# Patient Record
Sex: Male | Born: 1995 | Race: White | Hispanic: No | Marital: Single | Smoking: Former smoker
Health system: Southern US, Community
[De-identification: ages and names within clinical notes are randomized; demographics above are authoritative.]

## PROBLEM LIST (undated history)

## (undated) DIAGNOSIS — F319 Bipolar disorder, unspecified: Secondary | ICD-10-CM

## (undated) DIAGNOSIS — F419 Anxiety disorder, unspecified: Secondary | ICD-10-CM

## (undated) DIAGNOSIS — F259 Schizoaffective disorder, unspecified: Secondary | ICD-10-CM

## (undated) HISTORY — PX: TYMPANOSTOMY TUBE PLACEMENT: SHX32

---
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
## 2016-01-07 ENCOUNTER — Other Ambulatory Visit: Payer: Self-pay | Admitting: Nurse Practitioner

## 2016-01-07 DIAGNOSIS — E559 Vitamin D deficiency, unspecified: Secondary | ICD-10-CM

## 2016-01-07 LAB — BASIC METABOLIC PANEL
BUN/Creatinine Ratio: 16 (ref 9–20)
BUN: 14 mg/dL (ref 6–20)
CALCIUM: 9.2 mg/dL (ref 8.7–10.2)
CO2: 22 mmol/L (ref 18–29)
CREATININE: 0.89 mg/dL (ref 0.76–1.27)
Chloride: 100 mmol/L (ref 96–106)
GFR calc Af Amer: 143 mL/min/{1.73_m2} (ref >59)
GFR, EST NON AFRICAN AMERICAN: 124 mL/min/{1.73_m2} (ref >59)
Glucose: 92 mg/dL (ref 65–99)
POTASSIUM: 4.4 mmol/L (ref 3.5–5.2)
Sodium: 140 mmol/L (ref 134–144)

## 2016-01-07 LAB — CBC WITH DIFFERENTIAL/PLATELET
Basophils Absolute: 0 10*3/uL (ref 0.0–0.2)
Basos: 1 %
EOS (ABSOLUTE): 0.3 10*3/uL (ref 0.0–0.4)
EOS: 6 %
HEMATOCRIT: 45.2 % (ref 37.5–51.0)
HEMOGLOBIN: 15.6 g/dL (ref 12.6–17.7)
IMMATURE GRANULOCYTES: 1 %
Immature Grans (Abs): 0 10*3/uL (ref 0.0–0.1)
Lymphocytes Absolute: 1.9 10*3/uL (ref 0.7–3.1)
Lymphs: 35 %
MCH: 29.4 pg (ref 26.6–33.0)
MCHC: 34.5 g/dL (ref 31.5–35.7)
MCV: 85 fL (ref 79–97)
MONOCYTES: 11 %
Monocytes Absolute: 0.6 10*3/uL (ref 0.1–0.9)
NEUTROS PCT: 46 %
Neutrophils Absolute: 2.6 10*3/uL (ref 1.4–7.0)
Platelets: 220 10*3/uL (ref 150–379)
RBC: 5.3 x10E6/uL (ref 4.14–5.80)
RDW: 13.3 % (ref 12.3–15.4)
WBC: 5.4 10*3/uL (ref 3.4–10.8)

## 2016-01-07 LAB — HEPATIC FUNCTION PANEL
ALBUMIN: 4.4 g/dL (ref 3.5–5.5)
ALK PHOS: 61 IU/L (ref 39–117)
ALT: 35 IU/L (ref 0–44)
AST: 27 IU/L (ref 0–40)
BILIRUBIN, DIRECT: 0.11 mg/dL (ref 0.00–0.40)
Bilirubin Total: 0.4 mg/dL (ref 0.0–1.2)
TOTAL PROTEIN: 6.5 g/dL (ref 6.0–8.5)

## 2016-01-07 LAB — VITAMIN D 25 HYDROXY (VIT D DEFICIENCY, FRACTURES): Vit D, 25-Hydroxy: 18.4 ng/mL — ABNORMAL LOW (ref 30.0–100.0)

## 2016-01-07 LAB — TSH: TSH: 2.06 u[IU]/mL (ref 0.450–4.500)

## 2016-01-07 MED ORDER — VITAMIN D (ERGOCALCIFEROL) 1.25 MG (50000 UNIT) PO CAPS: 50000.0000 [IU] | ORAL_CAPSULE | ORAL | 2 refills | Status: DC

## 2016-02-15 ENCOUNTER — Ambulatory Visit (INDEPENDENT_AMBULATORY_CARE_PROVIDER_SITE_OTHER): Payer: Medicaid Other | Admitting: Nurse Practitioner

## 2016-02-15 ENCOUNTER — Encounter: Payer: Self-pay | Admitting: Nurse Practitioner

## 2016-02-15 VITALS — BP 122/72 | Temp 98.6°F | Ht 69.0 in | Wt 201.2 lb

## 2016-02-15 DIAGNOSIS — S46912A Strain of unspecified muscle, fascia and tendon at shoulder and upper arm level, left arm, initial encounter: Secondary | ICD-10-CM | POA: Diagnosis not present

## 2016-02-15 MED ORDER — METHOCARBAMOL 500 MG PO TABS: 500.0000 mg | ORAL_TABLET | Freq: Three times a day (TID) | ORAL | 1 refills | Status: DC | PRN

## 2016-02-15 NOTE — Progress Notes (Signed)
Subjective:  Presents for complaints of left shoulder popping that is been going on for several weeks. No history of injury. No pain in the shoulder joint area. Occasional spasm or discomfort in the upper back area. No limitations on activities. Saw Dr. Romeo AppleHarrison for right shoulder pain back in the summer. Has run out of his muscle relaxant which greatly helps his symptoms.  Objective:   BP 122/72   Temp 98.6 F (37 C) (Oral)   Ht 5\' 9"  (1.753 m)   Wt 201 lb 3.2 oz (91.3 kg)   BMI 29.71 kg/m  NAD. Alert, oriented. Lungs clear. Heart regular rate rhythm. Tight mildly tender muscles noted in the upper back area left side. Normal range of motion left shoulder, no tenderness. Mild popping noted near the before meals joint. Hand and arm strength 5+ bilateral.  Assessment: Strain of left shoulder, initial encounter  Plan:  Meds ordered this encounter  Medications  . methocarbamol (ROBAXIN) 500 MG tablet    Sig: Take 1 tablet (500 mg total) by mouth every 8 (eight) hours as needed for muscle spasms.    Dispense:  30 tablet    Refill:  1    Order Specific Question:   Supervising Provider    Answer:   Merlyn AlbertLUKING, Raiquan S [[id number]]  . Cariprazine HCl (VRAYLAR PO)    Sig: Take by mouth.   Stretching/shoulder exercises. Biofreeze as directed. Ice/heat applications. Muscle relaxant, use sparingly. Call back in 2-3 weeks if no improvement, sooner if worse.

## 2016-03-29 ENCOUNTER — Ambulatory Visit (INDEPENDENT_AMBULATORY_CARE_PROVIDER_SITE_OTHER): Payer: Medicaid Other | Admitting: Family Medicine

## 2016-03-29 VITALS — BP 122/74 | Temp 97.7°F | Wt 197.8 lb

## 2016-03-29 DIAGNOSIS — J019 Acute sinusitis, unspecified: Secondary | ICD-10-CM | POA: Diagnosis not present

## 2016-03-29 DIAGNOSIS — B9689 Other specified bacterial agents as the cause of diseases classified elsewhere: Secondary | ICD-10-CM | POA: Diagnosis not present

## 2016-03-29 MED ORDER — AMOXICILLIN 500 MG PO TABS: 500.0000 mg | ORAL_TABLET | Freq: Three times a day (TID) | ORAL | 0 refills | Status: DC

## 2016-03-29 NOTE — Progress Notes (Signed)
   Subjective:    Patient ID: David StampsWilliam M Rehm, male    DOB: [birth year], 20 y.o.   MRN: [id number]  Cough  This is a new problem. The current episode started yesterday. Associated symptoms include rhinorrhea and a sore throat. Pertinent negatives include no chest pain, ear pain, fever or wheezing. Treatments tried: Cough drops.  The treatment provided no relief.   The patient presents with some head congestion has been going on since Friday with drainage some coughing no wheezing or vomiting. Patient states no other concerns this visit.   Review of Systems  Constitutional: Negative for activity change and fever.  HENT: Positive for congestion, rhinorrhea and sore throat. Negative for ear pain.   Eyes: Negative for discharge.  Respiratory: Positive for cough. Negative for wheezing.   Cardiovascular: Negative for chest pain.       Objective:   Physical Exam  Constitutional: He appears well-developed.  HENT:  Head: Normocephalic.  Mouth/Throat: Oropharynx is clear and moist. No oropharyngeal exudate.  Neck: Normal range of motion.  Cardiovascular: Normal rate, regular rhythm and normal heart sounds.   No murmur heard. Pulmonary/Chest: Effort normal and breath sounds normal. He has no wheezes.  Lymphadenopathy:    He has no cervical adenopathy.  Neurological: He exhibits normal muscle tone.  Skin: Skin is warm and dry.  Nursing note and vitals reviewed.         Assessment & Plan:  Viral syndrome Secondary rhinosinusitis Antibiotics prescribed warning signs discussed follow-up if problems

## 2016-04-20 ENCOUNTER — Ambulatory Visit (INDEPENDENT_AMBULATORY_CARE_PROVIDER_SITE_OTHER): Payer: Medicaid Other | Admitting: Otolaryngology

## 2016-04-20 DIAGNOSIS — H6123 Impacted cerumen, bilateral: Secondary | ICD-10-CM

## 2016-04-24 ENCOUNTER — Telehealth: Payer: Self-pay | Admitting: Family Medicine

## 2016-04-24 MED ORDER — CEFPROZIL 500 MG PO TABS: 500.0000 mg | ORAL_TABLET | Freq: Two times a day (BID) | ORAL | 0 refills | Status: DC

## 2016-04-24 NOTE — Telephone Encounter (Signed)
Patient was seen for viral syndrome and secondary sinus infection -given amoxil. Patient still having problems with sore throat after finishing med

## 2016-04-24 NOTE — Telephone Encounter (Signed)
Cefzil 500 mg 1 twice a day for 7 days if ongoing troubles needs office visit

## 2016-04-24 NOTE — Telephone Encounter (Signed)
Pt has taken all of the antibiotic and his throat is still hurting. Pt is needing to know what else to do. Please advise.

## 2016-04-24 NOTE — Telephone Encounter (Signed)
Prescription sent electronically to pharmacy. Patient notified. 

## 2016-05-30 ENCOUNTER — Ambulatory Visit (INDEPENDENT_AMBULATORY_CARE_PROVIDER_SITE_OTHER): Payer: Medicaid Other | Admitting: Family Medicine

## 2016-05-30 ENCOUNTER — Encounter: Payer: Self-pay | Admitting: Family Medicine

## 2016-05-30 ENCOUNTER — Ambulatory Visit: Payer: Medicaid Other | Admitting: Family Medicine

## 2016-05-30 VITALS — Temp 98.0°F | Ht 69.0 in | Wt 196.0 lb

## 2016-05-30 DIAGNOSIS — G8929 Other chronic pain: Secondary | ICD-10-CM

## 2016-05-30 DIAGNOSIS — R059 Cough, unspecified: Secondary | ICD-10-CM

## 2016-05-30 DIAGNOSIS — R140 Abdominal distension (gaseous): Secondary | ICD-10-CM

## 2016-05-30 DIAGNOSIS — M545 Low back pain, unspecified: Secondary | ICD-10-CM

## 2016-05-30 DIAGNOSIS — R05 Cough: Secondary | ICD-10-CM | POA: Diagnosis not present

## 2016-05-30 DIAGNOSIS — R1110 Vomiting, unspecified: Secondary | ICD-10-CM | POA: Diagnosis not present

## 2016-05-30 NOTE — Progress Notes (Signed)
   Subjective:    Patient ID: David StampsWilliam M Gironda, male    DOB: 01/06/1996, 21 y.o.   MRN: [id number]  Cough  This is a new problem. The current episode started 1 to 4 weeks ago. Associated symptoms include nasal congestion and rhinorrhea. Pertinent negatives include no chest pain, ear pain, fever or wheezing. Associated symptoms comments: Vomiting from cough.   This patient relates that he's been having a cough for no good reason denies fever sweats chills denies weight loss denies change and appetite cough comes and goes sometimes very deep It does cause posttussive vomiting that occurs intermittently over the past couple weeks. Patient denies heartburn denies abdominal pain sweats or chills. States he just once make sure nothing bad is going on  Patient also relates low back pain and stiffness and discomfort occasional radiation into the left leg he's been trying some over the counter measures along with stretches it seems to help some  Review of Systems  Constitutional: Negative for activity change and fever.  HENT: Positive for rhinorrhea. Negative for congestion and ear pain.   Eyes: Negative for discharge.  Respiratory: Positive for cough. Negative for wheezing.   Cardiovascular: Negative for chest pain.       Objective:   Physical Exam  Constitutional: He appears well-developed.  HENT:  Head: Normocephalic.  Mouth/Throat: Oropharynx is clear and moist. No oropharyngeal exudate.  Neck: Normal range of motion.  Cardiovascular: Normal rate, regular rhythm and normal heart sounds.   No murmur heard. Pulmonary/Chest: Effort normal and breath sounds normal. He has no wheezes.  Lymphadenopathy:    He has no cervical adenopathy.  Neurological: He exhibits normal muscle tone.  Skin: Skin is warm and dry.  Nursing note and vitals reviewed. Abdomen is soft no guarding rebound or tenderness Subjected discomfort in the lower back very tight hamstrings negative straight leg  raise  Patient also relates intermittent bloating sensation in the abdomen when he eats it is not linked any specific food no diarrhea associated with it. We will do lab work to rule out intra-abdominal pathology. We will also help rule out liver dysfunction pancreatic dysfunction celiac disease and also do chest x-ray because intermittent coughing     Assessment & Plan:  Cough-possibly environmental. No sign of any serious pathology. Chest x-ray recommended. Posttussive vomiting related to coughing Robitussin-DM when necessary lab work ordered  Intermittent vomiting related to abdominal bloating lab work ordered no sign of serious pathology Lumbar pain and discomfort intermittent sciatica exercises for hamstring and low back were shown to the patient-information was printed and given to him follow-up if ongoing troubles

## 2016-05-31 ENCOUNTER — Ambulatory Visit (HOSPITAL_COMMUNITY)
Admission: RE | Admit: 2016-05-31 | Discharge: 2016-05-31 | Disposition: A | Discharge status: Home / Self Care | Payer: Medicaid Other | Source: Ambulatory Visit | Attending: Family Medicine | Admitting: Family Medicine

## 2016-05-31 DIAGNOSIS — R05 Cough: Secondary | ICD-10-CM | POA: Diagnosis not present

## 2016-06-02 LAB — CBC WITH DIFFERENTIAL/PLATELET
Basophils Absolute: 0 10*3/uL (ref 0.0–0.2)
Basos: 0 %
EOS (ABSOLUTE): 0.3 10*3/uL (ref 0.0–0.4)
EOS: 6 %
HEMATOCRIT: 46.9 % (ref 37.5–51.0)
Hemoglobin: 15.3 g/dL (ref 13.0–17.7)
Immature Grans (Abs): 0 10*3/uL (ref 0.0–0.1)
Immature Granulocytes: 0 %
LYMPHS ABS: 1.8 10*3/uL (ref 0.7–3.1)
Lymphs: 30 %
MCH: 28.3 pg (ref 26.6–33.0)
MCHC: 32.6 g/dL (ref 31.5–35.7)
MCV: 87 fL (ref 79–97)
MONOCYTES: 11 %
Monocytes Absolute: 0.7 10*3/uL (ref 0.1–0.9)
NEUTROS ABS: 3.2 10*3/uL (ref 1.4–7.0)
Neutrophils: 53 %
Platelets: 261 10*3/uL (ref 150–379)
RBC: 5.41 x10E6/uL (ref 4.14–5.80)
RDW: 14.2 % (ref 12.3–15.4)
WBC: 5.9 10*3/uL (ref 3.4–10.8)

## 2016-06-02 LAB — HEPATIC FUNCTION PANEL
ALK PHOS: 73 IU/L (ref 39–117)
ALT: 18 IU/L (ref 0–44)
AST: 21 IU/L (ref 0–40)
Albumin: 4.8 g/dL (ref 3.5–5.5)
Bilirubin Total: 0.5 mg/dL (ref 0.0–1.2)
Bilirubin, Direct: 0.16 mg/dL (ref 0.00–0.40)
TOTAL PROTEIN: 7.1 g/dL (ref 6.0–8.5)

## 2016-06-02 LAB — TISSUE TRANSGLUTAMINASE, IGA

## 2016-06-02 LAB — LIPASE: Lipase: 24 U/L (ref 13–78)

## 2016-06-04 ENCOUNTER — Encounter: Payer: Self-pay | Admitting: Family Medicine

## 2017-03-08 IMAGING — DX DG CHEST 2V
2 series · 2 of 2 positions shown · non-contrast
Comparison: None.

CLINICAL DATA: Cough for a month

EXAM:
CHEST  2 VIEW

[chest pa]
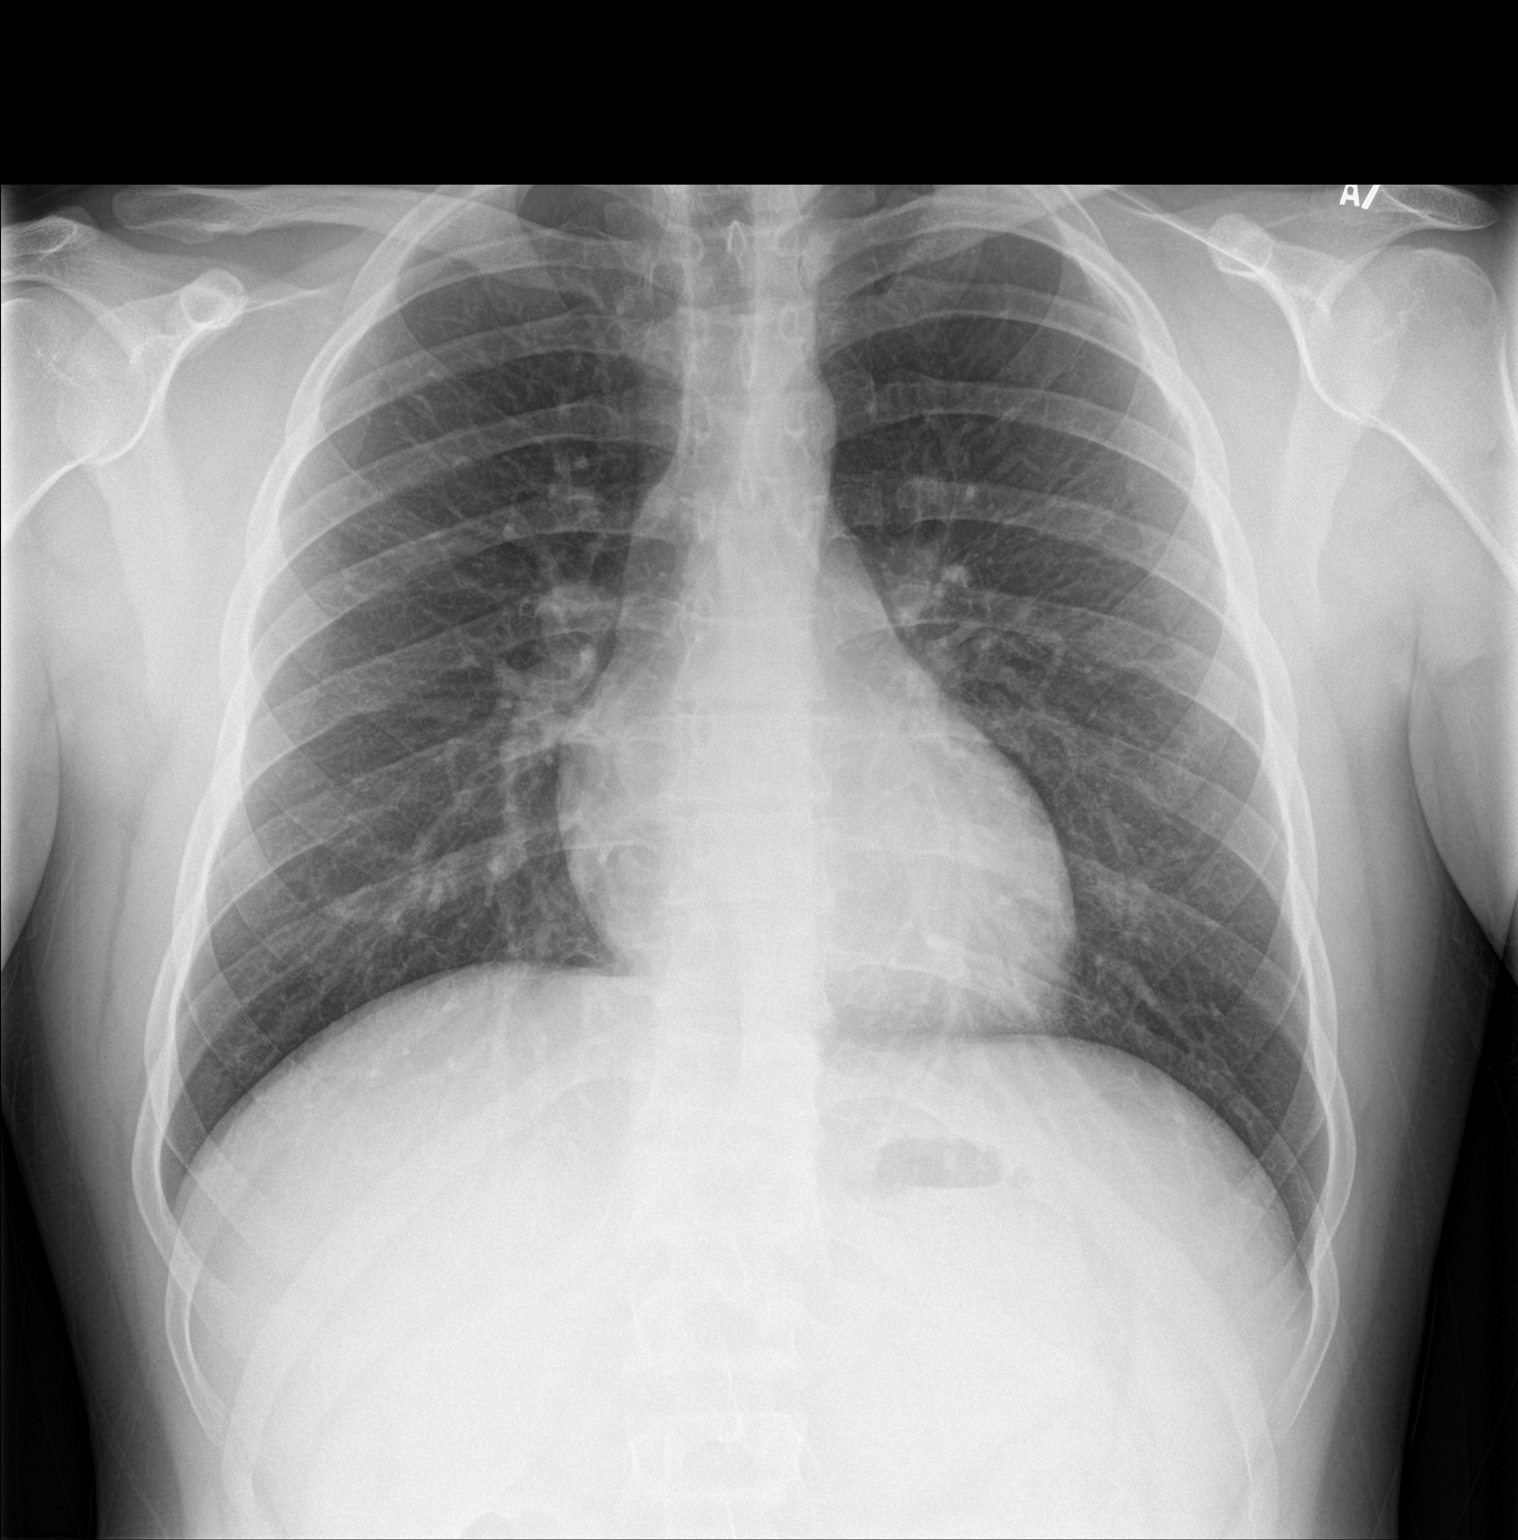

[chest lat]
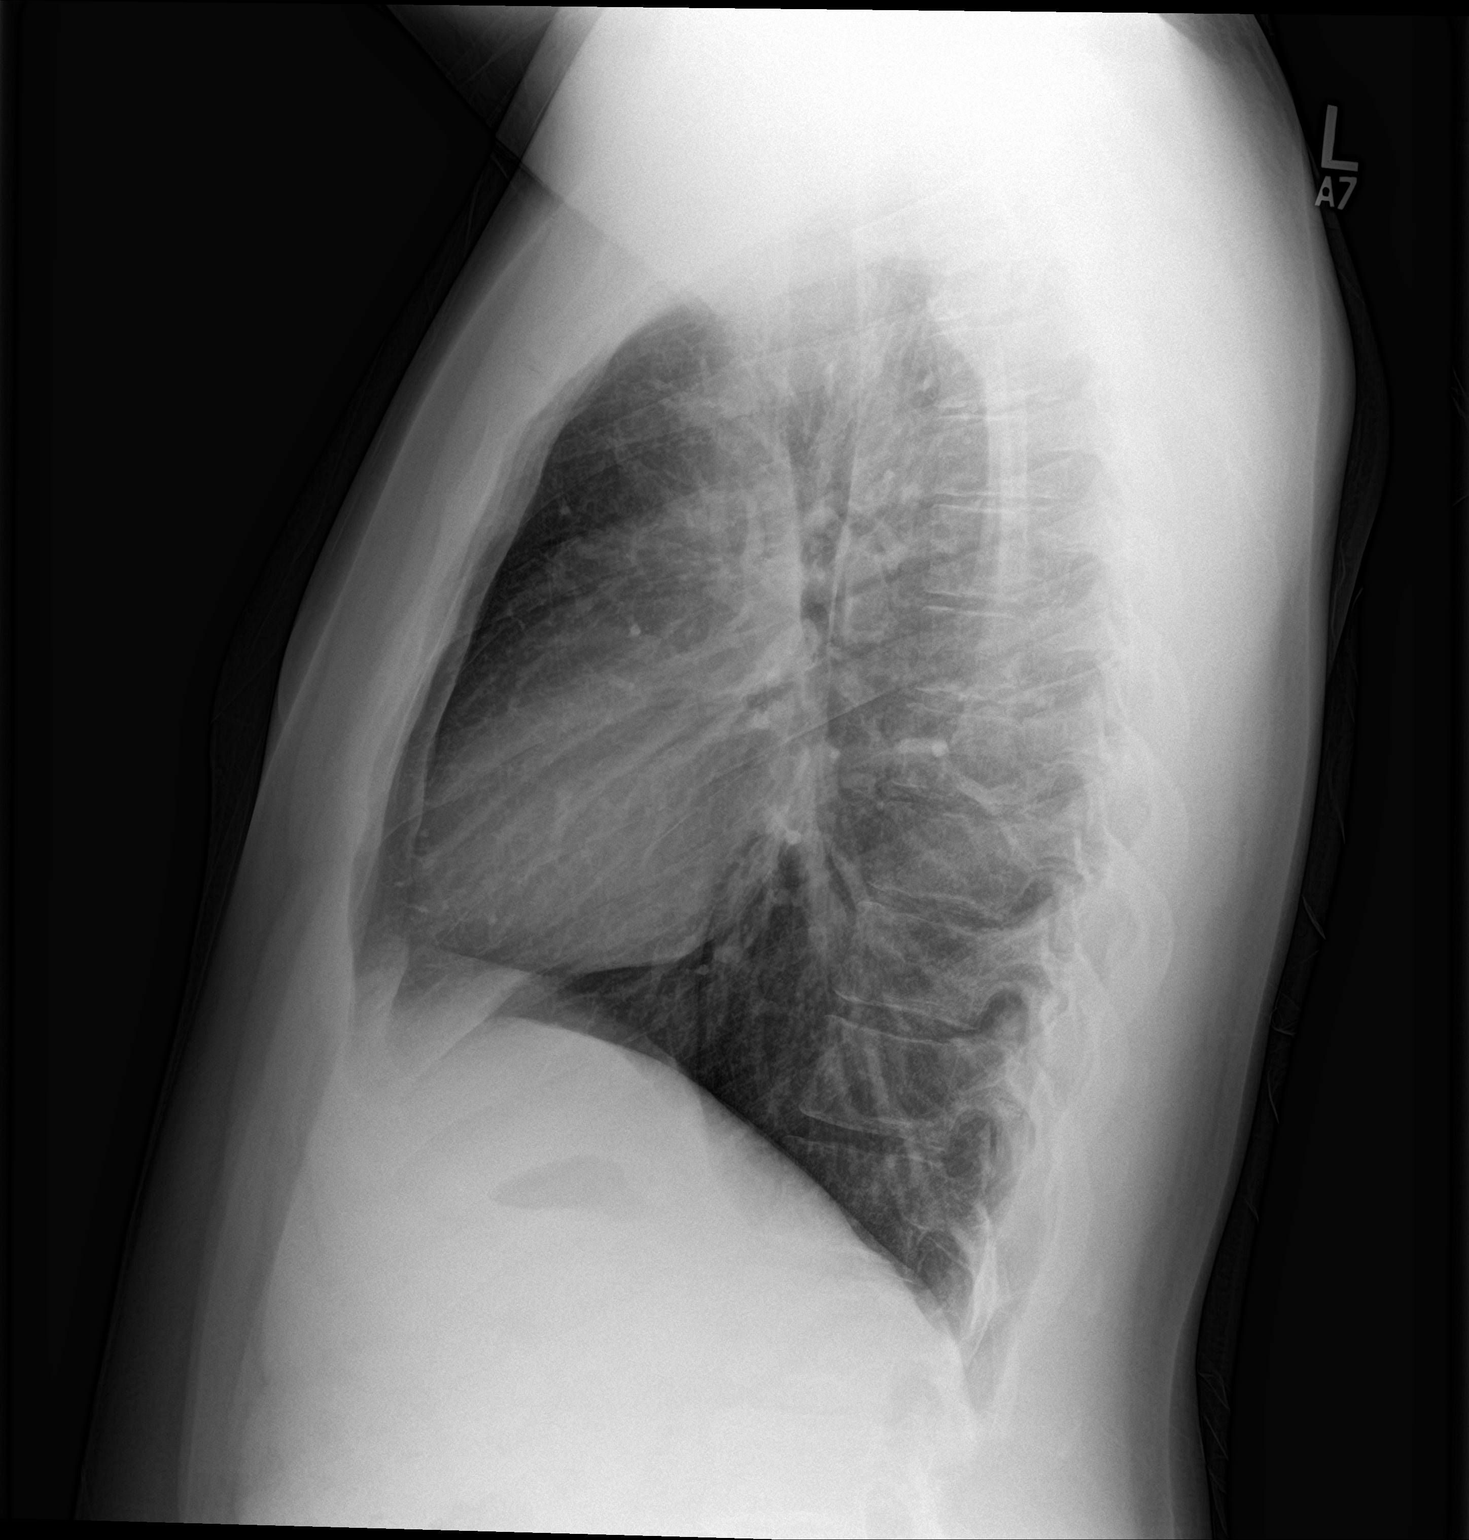

[2 of 2 positions shown; findings below may reference images not displayed]

FINDINGS: Heart and mediastinal contours are within normal limits. No focal
opacities or effusions. No acute bony abnormality.
IMPRESSION: No active cardiopulmonary disease.

## 2017-03-22 ENCOUNTER — Ambulatory Visit (INDEPENDENT_AMBULATORY_CARE_PROVIDER_SITE_OTHER): Payer: Medicaid Other

## 2017-03-22 DIAGNOSIS — Z23 Encounter for immunization: Secondary | ICD-10-CM

## 2017-04-11 ENCOUNTER — Telehealth: Payer: Self-pay

## 2017-04-11 NOTE — Telephone Encounter (Signed)
Patient called states he has been having som left side chest pain today. No other concerns/compliants,No Shortness of breath.Was told to go to the Ed to be evaluated,as they are capable of doing scans and blood work. He states understanding.

## 2017-04-19 ENCOUNTER — Ambulatory Visit (INDEPENDENT_AMBULATORY_CARE_PROVIDER_SITE_OTHER): Payer: Medicaid Other | Admitting: Otolaryngology

## 2017-04-19 DIAGNOSIS — H6123 Impacted cerumen, bilateral: Secondary | ICD-10-CM

## 2017-05-10 ENCOUNTER — Emergency Department (HOSPITAL_COMMUNITY)
Admission: EM | Admit: 2017-05-10 | Discharge: 2017-05-11 | Disposition: A | Discharge status: Other Acute Inpatient Hospital | Payer: Medicaid Other | Attending: Emergency Medicine | Admitting: Emergency Medicine

## 2017-05-10 DIAGNOSIS — R45851 Suicidal ideations: Secondary | ICD-10-CM | POA: Insufficient documentation

## 2017-05-10 DIAGNOSIS — Z022 Encounter for examination for admission to residential institution: Secondary | ICD-10-CM | POA: Diagnosis not present

## 2017-05-10 DIAGNOSIS — R440 Auditory hallucinations: Secondary | ICD-10-CM | POA: Diagnosis not present

## 2017-05-10 DIAGNOSIS — F251 Schizoaffective disorder, depressive type: Secondary | ICD-10-CM | POA: Diagnosis not present

## 2017-05-10 DIAGNOSIS — Z79899 Other long term (current) drug therapy: Secondary | ICD-10-CM | POA: Diagnosis not present

## 2017-05-10 DIAGNOSIS — F22 Delusional disorders: Secondary | ICD-10-CM

## 2017-05-10 DIAGNOSIS — Z008 Encounter for other general examination: Secondary | ICD-10-CM

## 2017-05-10 DIAGNOSIS — F29 Unspecified psychosis not due to a substance or known physiological condition: Secondary | ICD-10-CM | POA: Diagnosis not present

## 2017-05-10 DIAGNOSIS — F200 Paranoid schizophrenia: Secondary | ICD-10-CM | POA: Diagnosis present

## 2017-05-10 LAB — CBC WITH DIFFERENTIAL/PLATELET
Basophils Absolute: 0 10*3/uL (ref 0.0–0.1)
Basophils Relative: 0 %
Eosinophils Absolute: 0.2 10*3/uL (ref 0.0–0.7)
Eosinophils Relative: 2 %
HEMATOCRIT: 51 % (ref 39.0–52.0)
HEMOGLOBIN: 17.4 g/dL — AB (ref 13.0–17.0)
LYMPHS PCT: 20 %
Lymphs Abs: 1.4 10*3/uL (ref 0.7–4.0)
MCH: 30.2 pg (ref 26.0–34.0)
MCHC: 34.1 g/dL (ref 30.0–36.0)
MCV: 88.4 fL (ref 78.0–100.0)
MONOS PCT: 9 %
Monocytes Absolute: 0.6 10*3/uL (ref 0.1–1.0)
NEUTROS ABS: 4.7 10*3/uL (ref 1.7–7.7)
NEUTROS PCT: 69 %
Platelets: 265 10*3/uL (ref 150–400)
RBC: 5.77 MIL/uL (ref 4.22–5.81)
RDW: 12.4 % (ref 11.5–15.5)
WBC: 6.8 10*3/uL (ref 4.0–10.5)

## 2017-05-10 LAB — COMPREHENSIVE METABOLIC PANEL
ALK PHOS: 58 U/L (ref 38–126)
ALT: 15 U/L — AB (ref 17–63)
ANION GAP: 9 (ref 5–15)
AST: 16 U/L (ref 15–41)
Albumin: 5 g/dL (ref 3.5–5.0)
BILIRUBIN TOTAL: 1.3 mg/dL — AB (ref 0.3–1.2)
BUN: 16 mg/dL (ref 6–20)
CALCIUM: 9.6 mg/dL (ref 8.9–10.3)
CO2: 26 mmol/L (ref 22–32)
CREATININE: 1.09 mg/dL (ref 0.61–1.24)
Chloride: 101 mmol/L (ref 101–111)
GFR calc non Af Amer: >60 mL/min (ref >60)
Glucose, Bld: 98 mg/dL (ref 65–99)
Potassium: 4.4 mmol/L (ref 3.5–5.1)
Sodium: 136 mmol/L (ref 135–145)
TOTAL PROTEIN: 7.8 g/dL (ref 6.5–8.1)

## 2017-05-10 LAB — RAPID URINE DRUG SCREEN, HOSP PERFORMED
AMPHETAMINES: NOT DETECTED
BARBITURATES: NOT DETECTED
Benzodiazepines: NOT DETECTED
Cocaine: NOT DETECTED
Opiates: NOT DETECTED
Tetrahydrocannabinol: NOT DETECTED

## 2017-05-10 LAB — ACETAMINOPHEN LEVEL

## 2017-05-10 LAB — ETHANOL: Alcohol, Ethyl (B): <10 mg/dL (ref <10)

## 2017-05-10 LAB — SALICYLATE LEVEL

## 2017-05-10 MED ORDER — ONDANSETRON HCL 4 MG PO TABS: 4.0000 mg | ORAL_TABLET | Freq: Three times a day (TID) | ORAL | Status: DC | PRN

## 2017-05-10 MED ORDER — ZOLPIDEM TARTRATE 5 MG PO TABS
5.0000 mg | ORAL_TABLET | Freq: Every evening | ORAL | Status: DC | PRN
  Administered 2017-05-11: 5 mg via ORAL
  Filled 2017-05-10: qty 1

## 2017-05-10 MED ORDER — ALUM & MAG HYDROXIDE-SIMETH 200-200-20 MG/5ML PO SUSP: 30.0000 mL | Freq: Four times a day (QID) | ORAL | Status: DC | PRN

## 2017-05-10 MED ORDER — IBUPROFEN 200 MG PO TABS: 600.0000 mg | ORAL_TABLET | Freq: Three times a day (TID) | ORAL | Status: DC | PRN

## 2017-05-10 NOTE — ED Triage Notes (Signed)
Patient's aunt who he lives with states that patient hasnt been taking his medications for 7-8 months. Paranoia has gotten worse since around Christmas.  Aunt also Reports that patient slept in bed with uncle last night due to afraid people are after him.   Patient has pocket knife on him that was taken and given to aunt who took to her car.

## 2017-05-10 NOTE — ED Notes (Signed)
Pt oriented to room and unit.  Pts grandmother is with him.  Pt appears very fearful.  Pt was reassured of his safety.  15 minute checks and video monitoring in place.

## 2017-05-10 NOTE — ED Notes (Signed)
Pt A&O x 3, no distress noted, cooperative and anxious.  Monitoring for safety, Q 15 min checks in effect.

## 2017-05-10 NOTE — BH Assessment (Signed)
Assessment Note  David StampsWilliam M Fear [place] 22 [place] with associated AH and SI. Pt reports having symptoms of paranoia for @ a week. Pt also reports hearing voices telling him that people are going to do things to harm him. Additionally, pt reports SI for the past 2 days. He denies a specific plan, but he has considered shooting himself. He denies access or intention to do this, however. Pt reports having similar symptoms before and taking medication prescribed by [facility]. Pt indicates that he started feeling better and stopped the medication @ 5 months ago. Pt is amenable to IP hospitalization. Pt did not appear to be responding to internal stimuli during assessment.   Diagnosis: Schizoaffective d/o, depressive type  Past Medical History: No past medical history on file.  Past Surgical History:  Procedure Laterality Date  . TYMPANOSTOMY TUBE PLACEMENT      Family History:  Family History  Problem Relation Age of Onset  . Heart attack Other   . Mental illness Mother     Social History:  reports that  has never smoked. he has never used smokeless tobacco. He reports that he does not drink alcohol or use drugs.  Additional Social History:  Alcohol / Drug Use Pain Medications: pt denies Prescriptions: pt denies Over the Counter: pt denies History of alcohol / drug use?: No history of alcohol / drug abuse  CIWA: CIWA-Ar BP: 135/82 Pulse Rate: 98 COWS:    Allergies: No Known Allergies  Home Medications:  (Not in a hospital admission)  OB/GYN Status:  No LMP for male patient.  General Assessment Data [facility] of Assessment: [facility] TTS Assessment: In system Is this a Tele or Face-to-Face Assessment?: Face-to-Face Is this an Initial Assessment or a Re-assessment for this encounter?: Initial Assessment Marital status: Single Living Arrangements: Other relatives(grandparents) Can pt return to current living arrangement?: Yes Admission Status:  Voluntary Is patient capable of signing voluntary admission?: Yes Referral Source: Self/Family/Friend Insurance type: Medicaid     Crisis Care Plan Living Arrangements: Other relatives(grandparents) Name of Psychiatrist: none Name of Therapist: none  Education Status Is patient currently in school?: No Name of school: supposed to start at [employer] on 1/7th  Risk to self with the past 6 months Suicidal Ideation: Yes-Currently Present Has patient been a risk to self within the past 6 months prior to admission? : No Suicidal Intent: No Has patient had any suicidal intent within the past 6 months prior to admission? : No Is patient at risk for suicide?: Yes Suicidal Plan?: Yes-Currently Present Has patient had any suicidal plan within the past 6 months prior to admission? : No Specify Current Suicidal Plan: fleeting plan of shooting self Access to Means: No Previous Attempts/Gestures: No Intentional Self Injurious Behavior: None Family Suicide History: No Recent stressful life event(s): Other (Comment) Persecutory voices/beliefs?: Yes Depression: No Substance abuse history and/or treatment for substance abuse?: No Suicide prevention information given to non-admitted patients: Not applicable  Risk to Others within the past 6 months Homicidal Ideation: No Does patient have any lifetime risk of violence toward others beyond the six months prior to admission? : No Thoughts of Harm to Others: No Current Homicidal Intent: No Current Homicidal Plan: No Access to Homicidal Means: No History of harm to others?: No Assessment of Violence: None Noted Does patient have access to weapons?: No Criminal Charges Pending?: No Does patient have a court date: No Is patient on probation?: No  Psychosis Hallucinations:  Auditory Delusions: Persecutory  Mental Status Report Appearance/Hygiene: Unremarkable Eye Contact: Good Motor Activity: Unremarkable Speech: Logical/coherent Level of  Consciousness: Quiet/awake Mood: Pleasant Affect: Blunted Anxiety Level: Minimal Thought Processes: Coherent, Relevant Judgement: Impaired Orientation: Person, Place, Time, Situation Obsessive Compulsive Thoughts/Behaviors: Unable to Assess  Cognitive Functioning Concentration: Normal Memory: Recent Intact, Remote Intact IQ: Average Insight: see judgement above Impulse Control: Unable to Assess Appetite: Poor Sleep: Decreased Total Hours of Sleep: 5 Vegetative Symptoms: None  ADLScreening [facility] Assessment Services) Patient's cognitive ability adequate to safely complete daily activities?: Yes Patient able to express need for assistance with ADLs?: Yes Independently performs ADLs?: Yes (appropriate for developmental age)  Prior Inpatient Therapy Prior Inpatient Therapy: No  Prior Outpatient Therapy Prior Outpatient Therapy: Yes Prior Therapy Dates: [facility] Prior Therapy Facilty/Provider(s): early 2018 Reason for Treatment: anxiety, AH Does patient have an ACCT team?: No Does patient have Intensive In-House Services?  : No Does patient have [facility] services? : No Does patient have [facility] services?: No  ADL Screening (condition at time of admission) Patient's cognitive ability adequate to safely complete daily activities?: Yes Is the patient deaf or have difficulty hearing?: No Does the patient have difficulty seeing, even when wearing glasses/contacts?: No Does the patient have difficulty concentrating, remembering, or making decisions?: No Patient able to express need for assistance with ADLs?: Yes Does the patient have difficulty dressing or bathing?: No Independently performs ADLs?: Yes (appropriate for developmental age) Does the patient have difficulty walking or climbing stairs?: No Weakness of Legs: None Weakness of Arms/Hands: None  Home Assistive Devices/Equipment Home Assistive Devices/Equipment: None    Abuse/Neglect Assessment (Assessment to be complete  while patient is alone) Abuse/Neglect Assessment Can Be Completed: Yes Physical Abuse: Denies Verbal Abuse: Denies Sexual Abuse: Denies Exploitation of patient/patient's resources: Denies Self-Neglect: Denies Values / Beliefs Cultural Requests During Hospitalization: None Spiritual Requests During Hospitalization: None   Advance Directives (For Healthcare) Does Patient Have a Medical Advance Directive?: No Would patient like information on creating a medical advance directive?: No - Patient declined Nutrition Screen- [facility] Patient's home diet: Regular Has the patient been eating poorly because of a decreased appetite?: Yes  Additional Information 1:1 In Past 12 Months?: No CIRT Risk: No Elopement Risk: No Does patient have medical clearance?: Yes     Disposition:  Disposition Initial Assessment Completed for this Encounter: Yes(consulted with Nanine Means, DNP) Disposition of Patient: Inpatient treatment program Type of inpatient treatment program: Adult  On Site Evaluation by:   Reviewed with Physician:    Laddie Aquas 05/10/2017 5:57 PM

## 2017-05-10 NOTE — BH Assessment (Signed)
[facility] Assessment Progress Note     Per Fransico MichaelKim Brooks, RN, Admissions Coordinator, [facility] 05[facility]/2017, pending discharges from the unit and bed availability.

## 2017-05-10 NOTE — ED Provider Notes (Signed)
[facility]-EMERGENCY DEPT Provider Note   CSN: [id number] Arrival date & time: 05/10/17  1439     History   Chief Complaint Chief Complaint  Patient presents with  . Paranoid; Suicidal    HPI David [name] is a 22 y.o. male with a PMHx of psychiatric condition, IBS, GERD, and other conditions listed below, who presents to the ED with complaints of paranoid thoughts, auditory hallucinations, and suicidal ideations x 1 week.  Patient states that he has had issues like this before, he was put on Vraylar for this in the past when he was being seen at [facility], but he "took himself off of his medications" about 5 months ago.  Over the last 1 week he has been paranoid thinking that people are going to come and harm him, and has been hearing voices telling him that people are going to hurt him.  He has been feeling suicidal with no specific plan.  He denies visual hallucinations or homicidal ideations.  He does not use drugs, drink alcohol, or use tobacco.  He is here voluntarily, and denies any other medical complaints at this time.   The history is provided by the patient and medical records. No language interpreter was used.  Mental Health Problem  Presenting symptoms: depression, hallucinations and suicidal thoughts   Presenting symptoms: no homicidal ideas   Onset quality:  Gradual Duration:  1 week Timing:  Constant Progression:  Unchanged Chronicity:  Recurrent Context: noncompliance   Treatment compliance:  None of the time Time since last psychoactive medication taken:  5 months Relieved by:  None tried Worsened by:  Nothing Ineffective treatments:  None tried Associated symptoms: no abdominal pain and no chest pain   Risk factors: hx of mental illness   Risk factors: no recent psychiatric admission     No past medical history on file.  Patient Active Problem List   Diagnosis Date Noted  . Vitamin D deficiency 01/07/2016  . Gastroesophageal  reflux disease with esophagitis 08/31/2015  . Anxiety 08/31/2015  . Irritable bowel syndrome 08/30/2012  . Abdominal pain, chronic, epigastric 08/02/2012  . Left ankle sprain 08/02/2012    Past Surgical History:  Procedure Laterality Date  . TYMPANOSTOMY TUBE PLACEMENT         Home Medications    Prior to Admission medications   Medication Sig Start Date End Date Taking? Authorizing Provider  Cariprazine HCl (VRAYLAR PO) Take by mouth.    [provider]  citalopram (CELEXA) 10 MG tablet Take 10 mg by mouth daily.    [provider]  hydrOXYzine (VISTARIL) 25 MG capsule Take 25 mg by mouth 3 (three) times daily as needed.    [provider]  indomethacin (INDOCIN) 25 MG capsule Take 1 capsule (25 mg total) by mouth 3 (three) times daily with meals. 10/28/15   Vickki Hearing, MD  Vitamin D, Ergocalciferol, (DRISDOL) 50000 units CAPS capsule Take 1 capsule (50,000 Units total) by mouth every 7 (seven) days. 01/07/16   Campbell Riches, NP    Family History Family History  Problem Relation Age of Onset  . Heart attack Other   . Mental illness Mother     Social History Social History   Tobacco Use  . Smoking status: Never Smoker  . Smokeless tobacco: Never Used  Substance Use Topics  . Alcohol use: No  . Drug use: No     Allergies   Patient has no known allergies.   Review of  Systems Review of Systems  Constitutional: Negative for chills and fever.  Respiratory: Negative for shortness of breath.   Cardiovascular: Negative for chest pain.  Gastrointestinal: Negative for abdominal pain, constipation, diarrhea, nausea and vomiting.  Genitourinary: Negative for dysuria and hematuria.  Musculoskeletal: Negative for arthralgias and myalgias.  Skin: Negative for color change.  Allergic/Immunologic: Negative for immunocompromised state.  Neurological: Negative for weakness and numbness.  Psychiatric/Behavioral: Positive for hallucinations  and suicidal ideas. Negative for confusion and homicidal ideas.   All other systems reviewed and are negative for acute change except as noted in the HPI.    Physical Exam Updated Vital Signs BP 135/82 (BP Location: Right Arm)   Pulse 98   Temp 98 F (36.7 C) (Oral)   Resp 15   SpO2 100%   Physical Exam  Constitutional: He is oriented to person, place, and time. Vital signs are normal. He appears well-developed and well-nourished.  Non-toxic appearance. No distress.  Afebrile, nontoxic, NAD  HENT:  Head: Normocephalic and atraumatic.  Mouth/Throat: Oropharynx is clear and moist and mucous membranes are normal.  Eyes: Conjunctivae and EOM are normal. Right eye exhibits no discharge. Left eye exhibits no discharge.  Neck: Normal range of motion. Neck supple.  Cardiovascular: Normal rate, regular rhythm, normal heart sounds and intact distal pulses. Exam reveals no gallop and no friction rub.  No murmur heard. Pulmonary/Chest: Effort normal and breath sounds normal. No respiratory distress. He has no decreased breath sounds. He has no wheezes. He has no rhonchi. He has no rales.  Abdominal: Soft. Normal appearance and bowel sounds are normal. He exhibits no distension. There is no tenderness. There is no rigidity, no rebound, no guarding, no CVA tenderness, no tenderness at McBurney's point and negative Murphy's sign.  Musculoskeletal: Normal range of motion.  Neurological: He is alert and oriented to person, place, and time. He has normal strength. No sensory deficit.  Skin: Skin is warm, dry and intact. No rash noted.  Psychiatric: His mood appears anxious. He is actively hallucinating. Thought content is paranoid. He exhibits a depressed mood. He expresses suicidal ideation. He expresses no homicidal ideation. He expresses no suicidal plans and no homicidal plans.  Anxious and depressed affect, paranoid thoughts, repeatedly asking if he's safe in his room, but overall pleasant and  cooperative. Endorsing SI without a plan, denies HI. Reports AH but denies VH, doesn't seem to be responding to internal stimuli.   Nursing note and vitals reviewed.    ED Treatments / Results  Labs (all labs ordered are listed, but only abnormal results are displayed) Labs Reviewed  CBC WITH DIFFERENTIAL/PLATELET - Abnormal; Notable for the following components:      Result Value   Hemoglobin 17.4 (*)    All other components within normal limits  COMPREHENSIVE METABOLIC PANEL - Abnormal; Notable for the following components:   ALT 15 (*)    Total Bilirubin 1.3 (*)    All other components within normal limits  ACETAMINOPHEN LEVEL - Abnormal; Notable for the following components:   Acetaminophen (Tylenol), Serum <10 (*)    All other components within normal limits  ETHANOL  SALICYLATE LEVEL  RAPID URINE DRUG SCREEN, HOSP PERFORMED    EKG  EKG Interpretation None       Radiology No results found.  Procedures Procedures (including critical care time)  Medications Ordered in ED Medications  zolpidem (AMBIEN) tablet 5 mg (not administered)  ondansetron (ZOFRAN) tablet 4 mg (not administered)  alum & mag hydroxide-simeth (  MAALOX/MYLANTA) 200-200-20 MG/5ML suspension 30 mL (not administered)  ibuprofen (ADVIL,MOTRIN) tablet 600 mg (not administered)     Initial Impression / Assessment and Plan / ED Course  I have reviewed the triage vital signs and the nursing notes.  Pertinent labs & imaging results that were available during my care of the patient were reviewed by me and considered in my medical decision making (see chart for details).     22 y.o. male here with paranoid thoughts and SI x1wk, as well as AH hearing voices telling him people are going to hurt him. Denies VH or HI. On exam, anxious and paranoid, slightly depressed, repeatedly asking if he's safe in his room; otherwise unremarkable physical exam. Will get psych clearance labs and reassess shortly.    5:09 PM CBC w/diff WNL. CMP with marginally elevated TBili 1.3 likely from poor hydration recently, will have him encourage PO intake while here. EtOH level undetectable. Salicylate and acetaminophen levels WNL. UDS unremarkable. Pt medically cleared at this time. Psych hold orders placed, will hold off on reordering his home meds since he's been off them for several months, will leave that up to psych to decide to reorder or not. Please see TTS notes for further documentation of care/dispo. PLEASE NOTE THAT PT IS HERE VOLUNTARILY AT THIS TIME, IF PT TRIES TO LEAVE THEY WOULD NEED IVC PAPERWORK TAKEN OUT. Pt stable at time of med clearance.     Final Clinical Impressions(s) / ED Diagnoses   Final diagnoses:  Suicidal ideation  Paranoid ideation [facility](HCC)  Auditory hallucinations  Medical clearance for psychiatric admission    ED Discharge Orders    [address]None       Jaquala Fuller, [place]ralMercedes, [place]PA-C 05/10/17 1710    Benjiman CorePickering, Nathan, MD 05/11/17 [phone]

## 2017-05-10 NOTE — ED Notes (Addendum)
Pt very paranoid, stating he feels like someone is going to stab or hurt him.  Pt reassured by staff that he is safe, in a safe place and will not be harmed.

## 2017-05-10 NOTE — ED Triage Notes (Signed)
Pt reports that he has been feeling paranoid for approx. 1 week. Pt admits to having AH/VH. Pt states that voices are telling him that people are trying to harm him. Pt states that he has been treated for similar issues in the past and he was prescribed medication. Pt stopped taking medications approximately 4-5 months ago. Pt also reports that he is suicidal but has no plan.

## 2017-05-11 ENCOUNTER — Other Ambulatory Visit: Payer: Self-pay

## 2017-05-11 ENCOUNTER — Encounter (HOSPITAL_COMMUNITY): Payer: Self-pay | Admitting: *Deleted

## 2017-05-11 ENCOUNTER — Inpatient Hospital Stay (HOSPITAL_COMMUNITY)
Admission: AD | Admit: 2017-05-11 | Discharge: 2017-05-19 | DRG: 885 | Disposition: A | Discharge status: Home / Self Care | Payer: Medicaid Other | Source: Intra-hospital | Attending: Psychiatry | Admitting: Psychiatry

## 2017-05-11 DIAGNOSIS — F419 Anxiety disorder, unspecified: Secondary | ICD-10-CM

## 2017-05-11 DIAGNOSIS — R440 Auditory hallucinations: Secondary | ICD-10-CM | POA: Diagnosis not present

## 2017-05-11 DIAGNOSIS — G4700 Insomnia, unspecified: Secondary | ICD-10-CM | POA: Diagnosis present

## 2017-05-11 DIAGNOSIS — R4587 Impulsiveness: Secondary | ICD-10-CM | POA: Diagnosis not present

## 2017-05-11 DIAGNOSIS — Z560 Unemployment, unspecified: Secondary | ICD-10-CM | POA: Diagnosis not present

## 2017-05-11 DIAGNOSIS — F29 Unspecified psychosis not due to a substance or known physiological condition: Secondary | ICD-10-CM

## 2017-05-11 DIAGNOSIS — Z9114 Patient's other noncompliance with medication regimen: Secondary | ICD-10-CM | POA: Diagnosis not present

## 2017-05-11 DIAGNOSIS — K219 Gastro-esophageal reflux disease without esophagitis: Secondary | ICD-10-CM | POA: Diagnosis present

## 2017-05-11 DIAGNOSIS — Z818 Family history of other mental and behavioral disorders: Secondary | ICD-10-CM

## 2017-05-11 DIAGNOSIS — R45851 Suicidal ideations: Secondary | ICD-10-CM | POA: Diagnosis present

## 2017-05-11 DIAGNOSIS — F909 Attention-deficit hyperactivity disorder, unspecified type: Secondary | ICD-10-CM | POA: Diagnosis present

## 2017-05-11 DIAGNOSIS — F251 Schizoaffective disorder, depressive type: Principal | ICD-10-CM | POA: Diagnosis present

## 2017-05-11 DIAGNOSIS — R450 Nervousness: Secondary | ICD-10-CM | POA: Diagnosis not present

## 2017-05-11 DIAGNOSIS — F22 Delusional disorders: Secondary | ICD-10-CM

## 2017-05-11 DIAGNOSIS — R443 Hallucinations, unspecified: Secondary | ICD-10-CM | POA: Diagnosis not present

## 2017-05-11 DIAGNOSIS — Z598 Other problems related to housing and economic circumstances: Secondary | ICD-10-CM | POA: Diagnosis not present

## 2017-05-11 MED ORDER — CITALOPRAM HYDROBROMIDE 10 MG PO TABS
10.0000 mg | ORAL_TABLET | Freq: Every day | ORAL | Status: DC
  Administered 2017-05-12: 10 mg via ORAL
  Filled 2017-05-11 (×4): qty 1

## 2017-05-11 MED ORDER — CARBAMAZEPINE ER 200 MG PO TB12: 200.0000 mg | ORAL_TABLET | Freq: Two times a day (BID) | ORAL | Status: DC

## 2017-05-11 MED ORDER — RISPERIDONE 1 MG PO TABS
1.0000 mg | ORAL_TABLET | Freq: Two times a day (BID) | ORAL | Status: DC
  Administered 2017-05-11 – 2017-05-12 (×3): 1 mg via ORAL
  Filled 2017-05-11 (×9): qty 1

## 2017-05-11 MED ORDER — ONDANSETRON HCL 4 MG PO TABS: 4.0000 mg | ORAL_TABLET | Freq: Three times a day (TID) | ORAL | Status: DC | PRN

## 2017-05-11 MED ORDER — RISPERIDONE 1 MG PO TABS
1.0000 mg | ORAL_TABLET | Freq: Two times a day (BID) | ORAL | Status: DC
  Administered 2017-05-11: 1 mg via ORAL
  Filled 2017-05-11: qty 1

## 2017-05-11 MED ORDER — ACETAMINOPHEN 325 MG PO TABS: 650.0000 mg | ORAL_TABLET | Freq: Four times a day (QID) | ORAL | Status: DC | PRN

## 2017-05-11 MED ORDER — ALUM & MAG HYDROXIDE-SIMETH 200-200-20 MG/5ML PO SUSP
30.0000 mL | Freq: Four times a day (QID) | ORAL | Status: DC | PRN
  Administered 2017-05-15 – 2017-05-18 (×2): 30 mL via ORAL
  Filled 2017-05-11 (×2): qty 30

## 2017-05-11 MED ORDER — CITALOPRAM HYDROBROMIDE 10 MG PO TABS
10.0000 mg | ORAL_TABLET | Freq: Every day | ORAL | Status: DC
  Administered 2017-05-11: 10 mg via ORAL
  Filled 2017-05-11: qty 1

## 2017-05-11 MED ORDER — IBUPROFEN 600 MG PO TABS: 600.0000 mg | ORAL_TABLET | Freq: Three times a day (TID) | ORAL | Status: DC | PRN

## 2017-05-11 MED ORDER — MAGNESIUM HYDROXIDE 400 MG/5ML PO SUSP: 30.0000 mL | Freq: Every day | ORAL | Status: DC | PRN

## 2017-05-11 NOTE — Tx Team (Signed)
Initial Treatment Plan 05/11/2017 3:46 PM David StampsWilliam M Nan ZOX:[id number]    PATIENT STRESSORS: Medication change or noncompliance   PATIENT STRENGTHS: Licensed conveyancerCommunication skills Financial means Motivation for treatment/growth Physical Health Supportive family/friends   PATIENT IDENTIFIED PROBLEMS: Paranoia/Psychosis  Suicidal ideation  "Get back on my meds"                 DISCHARGE CRITERIA:  Improved stabilization in mood, thinking, and/or behavior Verbal commitment to aftercare and medication compliance  PRELIMINARY DISCHARGE PLAN: Outpatient therapy Medication management  PATIENT/FAMILY INVOLVEMENT: This treatment plan has been presented to and reviewed with the patient, David [name].  The patient and family have been given the opportunity to ask questions and make suggestions.  Levin BaconHeather V Karagan Lehr, RN 05/11/2017, 3:46 PM

## 2017-05-11 NOTE — ED Notes (Signed)
[facility] that [facility] will call us back.

## 2017-05-11 NOTE — BH Assessment (Signed)
[facility]   05/11/16: Per AC, [phone].

## 2017-05-11 NOTE — Consult Note (Addendum)
[place] Psychiat[facility] Consult   Reason for Consult:  Psychosis  Referring Physician:  EDP Patient Identification: David [name] MRN:  [id number] Principal Diagnosis: Psychosis [facility]) Diagnosis:   Patient Active Problem List   Diagnosis Date Noted  . Schizoaffective disorder, depressive type ([place]) [F25.1] 05/11/2017  . Vitamin D deficiency [E55.9] 01/07/2016  . Gastroesophageal reflux disease with esophagitis [K21.0] 08/31/2015  . Anxiety [F41.9] 08/31/2015  . Irritable bowel syndrome [K58.9] 08/30/2012  . Abdominal pain, chronic, epigastric [R10.13, G89.29] 08/02/2012  . Left ankle sprain [S93.402A] 08/02/2012    Total Time spent with patient: 45 minutes  Subjective:   David [name] is a 22 y.o. male patient admitted with psychosis.  HPI:   Per chart review, patient was admitted with SI, AH and paranoia. He reported SI for the past 2 days without a specific plan. He stopped taking his medications 5 months ago because he felt better. He is seen pacing the hall prior to interview. He reports that he is okay with admission to the psychiatric facility. He reports taking Vraylar in the past but he is unsure if it was helpful.   Past Psychiatric History: Schizoaffective disorder, depressive type.  Risk to Self: Suicidal Ideation: Yes-Currently Present Suicidal Intent: No Is patient at risk for suicide?: Yes Suicidal Plan?: Yes-Currently Present Specify Current Suicidal Plan: fleeting plan of shooting self Access to Means: No Intentional Self Injurious Behavior: None Risk to Others: Homicidal Ideation: No Thoughts of Harm to Others: No Current Homicidal Intent: No Current Homicidal Plan: No Access to Homicidal Means: No History of harm to others?: No Assessment of Violence: None Noted Does patient have access to weapons?: No Criminal Charges Pending?: No Does patient have a court date: No Prior Inpatient Therapy: Prior Inpatient Therapy: No Prior Outpatient  Therapy: Prior Outpatient Therapy: Yes Prior Therapy Dates: [facility] Prior Therapy Facilty/Provider(s): early 2018 Reason for Treatment: anxiety, AH Does patient have an ACCT team?: No Does patient have Intensive In-House Services?  : No Does patient have [facility] services? : No Does patient have [facility] services?: No  Past Medical History: No past medical history on file.  Past Surgical History:  Procedure Laterality Date  . TYMPANOSTOMY TUBE PLACEMENT     Family History:  Family History  Problem Relation Age of Onset  . Heart attack Other   . Mental illness Mother    Family Psychiatric  History: Unknown  Social History:  Social History   Substance and Sexual Activity  Alcohol Use No     Social History   Substance and Sexual Activity  Drug Use No    Social History   Socioeconomic History  . Marital status: Single    Spouse name: Not on file  . Number of children: Not on file  . Years of education: Not on file  . Highest education level: Not on file  Social Needs  . Financial resource strain: Not on file  . Food insecurity - worry: Not on file  . Food insecurity - inability: Not on file  . Transportation needs - medical: Not on file  . Transportation needs - non-medical: Not on file  Occupational History  . Not on file  Tobacco Use  . Smoking status: Never Smoker  . Smokeless tobacco: Never Used  Substance and Sexual Activity  . Alcohol use: No  . Drug use: No  . Sexual activity: No  Other Topics Concern  . Not on file  Social History Narrative  . Not on file  Additional Social History: N/A    Allergies:  No Known Allergies  Labs:  Results for orders placed or performed during the hospital encounter of 05/10/17 (from the past 48 hour(s))  Urine rapid drug screen (hosp performed)     Status: None   Collection Time: 05/10/17  3:50 PM  Result Value Ref Range   Opiates NONE DETECTED NONE DETECTED   Cocaine NONE DETECTED NONE DETECTED   Benzodiazepines  NONE DETECTED NONE DETECTED   Amphetamines NONE DETECTED NONE DETECTED   Tetrahydrocannabinol NONE DETECTED NONE DETECTED   Barbiturates NONE DETECTED NONE DETECTED    Comment: (NOTE) DRUG SCREEN FOR MEDICAL PURPOSES ONLY.  IF CONFIRMATION IS NEEDED FOR ANY PURPOSE, NOTIFY LAB WITHIN 5 DAYS. LOWEST DETECTABLE LIMITS FOR URINE DRUG SCREEN Drug Class                     Cutoff (ng/mL) Amphetamine and metabolites    1000 Barbiturate and metabolites    200 Benzodiazepine                 414 Tricyclics and metabolites     300 Opiates and metabolites        300 Cocaine and metabolites        300 THC                            50   CBC w/diff     Status: Abnormal   Collection Time: 05/10/17  4:17 PM  Result Value Ref Range   WBC 6.8 4.0 - 10.5 K/uL   RBC 5.77 4.22 - 5.81 MIL/uL   Hemoglobin 17.4 (H) 13.0 - 17.0 g/dL   HCT 51.0 39.0 - 52.0 %   MCV 88.4 78.0 - 100.0 fL   MCH 30.2 26.0 - 34.0 pg   MCHC 34.1 30.0 - 36.0 g/dL   RDW 12.4 11.5 - 15.5 %   Platelets 265 150 - 400 K/uL   Neutrophils Relative % 69 %   Neutro Abs 4.7 1.7 - 7.7 K/uL   Lymphocytes Relative 20 %   Lymphs Abs 1.4 0.7 - 4.0 K/uL   Monocytes Relative 9 %   Monocytes Absolute 0.6 0.1 - 1.0 K/uL   Eosinophils Relative 2 %   Eosinophils Absolute 0.2 0.0 - 0.7 K/uL   Basophils Relative 0 %   Basophils Absolute 0.0 0.0 - 0.1 K/uL  Comprehensive metabolic panel     Status: Abnormal   Collection Time: 05/10/17  4:17 PM  Result Value Ref Range   Sodium 136 135 - 145 mmol/L   Potassium 4.4 3.5 - 5.1 mmol/L   Chloride 101 101 - 111 mmol/L   CO2 26 22 - 32 mmol/L   Glucose, Bld 98 65 - 99 mg/dL   BUN 16 6 - 20 mg/dL   Creatinine, Ser 1.09 0.61 - 1.24 mg/dL   Calcium 9.6 8.9 - 10.3 mg/dL   Total Protein 7.8 6.5 - 8.1 g/dL   Albumin 5.0 3.5 - 5.0 g/dL   AST 16 15 - 41 U/L   ALT 15 (L) 17 - 63 U/L   Alkaline Phosphatase 58 38 - 126 U/L   Total Bilirubin 1.3 (H) 0.3 - 1.2 mg/dL   GFR calc non Af Amer >60 >60  mL/min   GFR calc Af Amer >60 >60 mL/min    Comment: (NOTE) The eGFR has been calculated using the CKD EPI equation. This calculation has not  been validated in all clinical situations. eGFR's persistently <60 mL/min signify possible Chronic Kidney Disease.    Anion gap 9 5 - 15  Ethanol     Status: None   Collection Time: 05/10/17  4:17 PM  Result Value Ref Range   Alcohol, Ethyl (B) <10 <10 mg/dL    Comment:        LOWEST DETECTABLE LIMIT FOR SERUM ALCOHOL IS 10 mg/dL FOR MEDICAL PURPOSES ONLY   Salicylate level     Status: None   Collection Time: 05/10/17  4:17 PM  Result Value Ref Range   Salicylate Lvl <1.7 2.8 - 30.0 mg/dL  Acetaminophen level     Status: Abnormal   Collection Time: 05/10/17  4:17 PM  Result Value Ref Range   Acetaminophen (Tylenol), Serum <10 (L) 10 - 30 ug/mL    Comment:        THERAPEUTIC CONCENTRATIONS VARY SIGNIFICANTLY. A RANGE OF 10-30 ug/mL MAY BE AN EFFECTIVE CONCENTRATION FOR MANY PATIENTS. HOWEVER, SOME ARE BEST TREATED AT CONCENTRATIONS OUTSIDE THIS RANGE. ACETAMINOPHEN CONCENTRATIONS >150 ug/mL AT 4 HOURS AFTER INGESTION AND >50 ug/mL AT 12 HOURS AFTER INGESTION ARE OFTEN ASSOCIATED WITH TOXIC REACTIONS.     Current Facility-Administered Medications  Medication Dose Route Frequency Provider Last Rate Last Dose  . alum & mag hydroxide-simeth (MAALOX/MYLANTA) 200-200-20 MG/5ML suspension 30 mL  30 mL Oral Q6H PRN Street, [place], [place]      . carbamazepine (TEGRETOL XR) 12 hr tablet 200 mg  200 mg Oral BID [name], NP      . citalopram (CELEXA) tablet 10 mg  10 mg Oral Daily Lord, Jamison Y, NP      . ibuprofen (ADVIL,MOTRIN) tablet 600 mg  600 mg Oral Q8H PRN Street, [place], [place]      . ondansetron [facility]) tablet 4 mg  4 mg Oral Q8H PRN Street, [place], [place]      . risperiDONE (RISPERDAL) tablet 1 mg  1 mg Oral BID [name], NP      . zolpidem (AMBIEN) tablet 5 mg  5 mg Oral QHS PRN Street, [place], [place]   5 mg  at 05/11/17 0023   Current Outpatient Medications  Medication Sig Dispense Refill  . ibuprofen (ADVIL,MOTRIN) 200 MG tablet Take 200-400 mg by mouth daily as needed for moderate pain.    . Liniments (SALONPAS EX) Apply 1 application topically daily as needed (PAIN).    Marland Kitchen Liniments (SALONPAS PAIN RELIEF PATCH EX) Apply 1 patch topically daily as needed.    . Multiple Vitamins-Minerals (CENTRUM ADULTS PO) Take 1 tablet by mouth daily.    . naproxen sodium (ALEVE) 220 MG tablet Take 220 mg by mouth daily as needed (PAIN).    Marland Kitchen indomethacin (INDOCIN) 25 MG capsule Take 1 capsule (25 mg total) by mouth 3 (three) times daily with meals. (Patient not taking: Reported on 05/10/2017) 90 capsule 0  . Vitamin D, Ergocalciferol, (DRISDOL) 50000 units CAPS capsule Take 1 capsule (50,000 Units total) by mouth every 7 (seven) days. (Patient not taking: Reported on 05/10/2017) 4 capsule 2    Musculoskeletal: Strength & Muscle Tone: within normal limits Gait & Station: normal Patient leans: N/A  Psychiatric Specialty Exam: Physical Exam  Nursing note and vitals reviewed. Constitutional: He is oriented to person, place, and time. He appears well-developed and well-nourished.  HENT:  Head: Normocephalic and atraumatic.  Neck: Normal range of motion.  Respiratory: Effort normal.  Musculoskeletal: Normal range of motion.  Neurological: He is  alert and oriented to person, place, and time.  Skin: No rash noted.  Psychiatric: His speech is normal. His mood appears anxious. He is withdrawn. Cognition and memory are normal. He expresses impulsivity.    Review of Systems  Psychiatric/Behavioral: Positive for hallucinations (AH) and suicidal ideas. Negative for substance abuse. The patient is nervous/anxious.     Blood pressure (!) 143/79, pulse 85, temperature 97.8 F (36.6 C), resp. rate 18, SpO2 100 %.There is no height or weight on file to calculate BMI.  General Appearance: Disheveled, malodorous, young,  Caucasian male with an unshaved face, wearing hospital scrubs and standing in the hall. NAD.   Eye Contact:  Good  Speech:  Normal Rate  Volume:  Decreased  Mood:  Did not state  Affect:  Constricted  Thought Process:  Linear  Orientation:  Full (Time, Place, and Person)  Thought Content:  Logical  Suicidal Thoughts:  Yes.  without intent/plan  Homicidal Thoughts:  No  Memory:  Immediate;   Fair Recent;   Fair Remote;   Fair  Judgement:  Fair  Insight:  Fair  Psychomotor Activity:  Normal  Concentration:  Concentration: Fair and Attention Span: Fair  Recall:  [employer] of Knowledge:  Fair  Language:  Fair  Akathisia:  No  Handed:  Right  AIMS (if indicated):   N/A  Assets:  Housing  ADL's:  Intact  Cognition:  WNL  Sleep:   N/A   Assessment:  David [name] is a 22 y.o. male who presented with psychosis and SI in the setting of medication noncompliance. He warrants inpatient psychiatric hospitalization for stabilization and treatment.   Treatment Plan Summary: Daily contact with patient to assess and evaluate symptoms and progress in treatment and Medication management  -Start Celexa 10 mg daily and Risperdal 1 mg BID.    Disposition: Recommend psychiatric Inpatient admission when medically cleared.  Faythe Dingwall, DO 05/11/2017 10:21 AM

## 2017-05-11 NOTE — ED Notes (Signed)
Pt transported to [facility] by [facility]. All belongings returned to pt who signed for same.  

## 2017-05-11 NOTE — ED Notes (Signed)
Pt remains very paranoid.

## 2017-05-11 NOTE — BH Assessment (Addendum)
[facility] Assessment Progress Note  This Clinical research associatewriter found pt's signed consent for admission forms on his chart.  [facility].  Doylene Canninghomas Anselmo Reihl, [place] Behavioral Health Coordinator [phone]   Addendum:  Per Malva LimesLinsey Strader, RN, [facility], [facility] at 13:00.  Pt is to be transported via [facility].  Pt's nurse, Diane, has been notified.  Doylene Canninghomas Izzy Doubek, [place] Behavioral Health Coordinator [phone]

## 2017-05-11 NOTE — Progress Notes (Signed)
Pt on unit distancing himself from other patients.  Sts he is afraid of his roommate and does not want to sleep in that room. Pt is anxious, guarded and asking if he will be safe on the unit. Pt denies Hi and AVH.  Pt indorses passive SI and verbally contracts for safety. Pt moved to the quiet room to sleep.  Given scheduled medications.  15 min checks in place for pt safety.  Pt remains safe.

## 2017-05-11 NOTE — Progress Notes (Signed)
David [name] is a 22 year old male being admitted voluntarily to [facility] from [facility].  He came in with paranoia, auditory hallucinations and suicidal ideation.  He reported stopping his medications 5 months ago but symptoms were better on medications.  He denied any medical issues.  No pain or discomfort.  He is diagnosed with Schizoaffective Disorder, depressed type.  [facility], he continues to voice passive SI but David [name] contract for safety on the unit.  He continues to hear voices and they sometimes tell him to hurt himself.  He was pleasant and cooperative but does report that he was experiencing anxiety.  His pulse was elevated up to 141 when standing but while sitting it would range from 107-115.  Oriented him to the unit.  Admission paperwork completed and signed.  Belongings searched and secured in locker # 40.  Skin assessment completed and no skin issues noted.  Q 15 minute checks initiated for safety.  We David [name] monitor the progress towards his goals.

## 2017-05-12 DIAGNOSIS — F419 Anxiety disorder, unspecified: Secondary | ICD-10-CM

## 2017-05-12 DIAGNOSIS — R450 Nervousness: Secondary | ICD-10-CM

## 2017-05-12 DIAGNOSIS — Z818 Family history of other mental and behavioral disorders: Secondary | ICD-10-CM

## 2017-05-12 DIAGNOSIS — F251 Schizoaffective disorder, depressive type: Principal | ICD-10-CM

## 2017-05-12 DIAGNOSIS — Z598 Other problems related to housing and economic circumstances: Secondary | ICD-10-CM

## 2017-05-12 DIAGNOSIS — G4700 Insomnia, unspecified: Secondary | ICD-10-CM

## 2017-05-12 DIAGNOSIS — Z560 Unemployment, unspecified: Secondary | ICD-10-CM

## 2017-05-12 DIAGNOSIS — R45851 Suicidal ideations: Secondary | ICD-10-CM

## 2017-05-12 MED ORDER — TRAZODONE HCL 50 MG PO TABS
50.0000 mg | ORAL_TABLET | Freq: Every day | ORAL | Status: DC
  Administered 2017-05-13 – 2017-05-14 (×2): 50 mg via ORAL
  Filled 2017-05-12 (×10): qty 1

## 2017-05-12 MED ORDER — HYDROXYZINE HCL 25 MG PO TABS
25.0000 mg | ORAL_TABLET | ORAL | Status: DC | PRN
  Administered 2017-05-14 – 2017-05-18 (×5): 25 mg via ORAL
  Filled 2017-05-12 (×5): qty 1

## 2017-05-12 MED ORDER — RISPERIDONE 0.5 MG PO TABS
0.5000 mg | ORAL_TABLET | Freq: Every day | ORAL | Status: DC
  Administered 2017-05-12: 0.5 mg via ORAL
  Filled 2017-05-12: qty 1

## 2017-05-12 MED ORDER — RISPERIDONE 2 MG PO TABS
2.0000 mg | ORAL_TABLET | Freq: Every day | ORAL | Status: DC
  Administered 2017-05-13: 2 mg via ORAL
  Filled 2017-05-12 (×3): qty 1

## 2017-05-12 NOTE — [facility] Group Notes (Signed)
[facility]  Date/Time:    05/12/2017 11:15-11:45AM  Type of Therapy and Topic:  Group Therapy:  Healthy vs Unhealthy Coping Skills  Participation Level:  Active   Description of Group:  The focus of this group was to determine what healthy coping techniques would be helpful in coping with various problems in the coming New Year. Patients were guided in becoming aware of the differences between healthy and unhealthy coping techniques.  Patients were asked to identify 1-2 healthy coping skills they would like to learn to use more effectively, and many mentioned self-acceptance, breathing, positive affirmations and walking away.  Therapeutic Goals 1. Patients learned learned the difference between healthy vs unhealthy coping techniques 2. Each patient identified 1 healthy coping skill they would like to become more familiar with and use throughout the coming year       3.   Patients discussed specific plans for how to incorporate that new skill into their daily lives 4.   Patients provided support and ideas to each other     Summary of Patient Progress: During group, patient expressed that he needs to learn to socialize more and communicate better.  During the session, other people talked about how they socialize and start conversations, and he was encouraged to take note of what they were saying, and to practice it.  He did so several different times in group and was given positive feedback each time.   Therapeutic Modalities Cognitive Behavioral Therapy Motivational Interviewing   Ambrose MantleMareida Grossman-Orr, LCSW 05/12/2017, 12:50 PM
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
## 2017-05-12 NOTE — [facility] Suicide Risk Assessment (Signed)
[facility]   Nursing information obtained from:  Patient Demographic factors:  Male, Caucasian, Low socioeconomic status, Unemployed, Access to firearms Current Mental Status:  Suicidal ideation indicated by patient, Suicide plan Loss Factors:  Financial problems / change in socioeconomic status Historical Factors:  Impulsivity Risk Reduction Factors:  Living with another person, especially a relative  Total Time spent with patient: 30 minutes Principal Problem: Schizoaffective disorder, depressive type (HCC) Diagnosis:   Patient Active Problem List   Diagnosis Date Noted  . Schizoaffective disorder, depressive type (HCC) [F25.1] 05/11/2017  . Psychosis (HCC) [F29] 05/11/2017  . Vitamin D deficiency [E55.9] 01/07/2016  . Gastroesophageal reflux disease with esophagitis [K21.0] 08/31/2015  . Anxiety [F41.9] 08/31/2015  . Irritable bowel syndrome [K58.9] 08/30/2012  . Abdominal pain, chronic, epigastric [R10.13, G89.29] 08/02/2012  . Left ankle sprain [S93.402A] 08/02/2012   Subjective Data:  22 y.o Caucasian male, lives with his family. Known history of Schizoaffective disorder. [facility] Patient has been off is medications for a couple of months. Psychosis has relapsed. He is paranoid as he feels there are people out to get him. He has not been able to function adequately. Patient has expressed desire to shoot self in the head. He agreed to voluntary admission. No substances. Patient reports auditory hallucinations. Says he cannot make out what they are saying. Projects it onto others. Says he felt there were out to get him. He feels better now he has started taking medications. Says the voices are getting quieter. He has better insight and says now he knows he needs to stay on his medications. No current suicidal thoughts. Feels a bit sedated with is medication. We have agreed to make it at nighttime from tomorrow. We explored LAI and he  consented.   Continued Clinical Symptoms:  Alcohol Use Disorder Identification Test Final Score (AUDIT): 0 The "Alcohol Use Disorders Identification Test", Guidelines for Use in Primary Care, Second Edition.  World Science writerHealth Organization [facility](WHO). Score between 0-7:  no or low risk or alcohol related problems. Score between 8-15:  moderate risk of alcohol related problems. Score between 16-19:  high risk of alcohol related problems. Score 20 or above:  warrants further diagnostic evaluation for alcohol dependence and treatment.   CLINICAL FACTORS:  Schizophrenia    Musculoskeletal: Strength & Muscle Tone: within normal limits Gait & Station: normal Patient leans: N/A  Psychiatric Specialty Exam: Physical Exam  Constitutional: He appears well-developed and well-nourished.  Respiratory: Effort normal.  Psychiatric:  As above     ROS  Blood pressure 101/69, pulse 89, temperature 97.6 F (36.4 C), temperature source Oral, resp. rate 16, height 5\' 9"  (1.753 m), weight 72.1 kg (159 lb), SpO2 99 %.Body mass index is 23.48 kg/m.  General Appearance: Casually dressed, calm and cooperative. Good relatedness.   Eye Contact:  Good  Speech:  Clear and Coherent and Normal Rate  Volume:  Normal  Mood:  Feels better  Affect:  Blunted and mood congruent  Thought Process:  Linear  Orientation:  Full (Time, Place, and Person)  Thought Content:  Less hallucinations. Less paranoia. No preoccupation with violent thoughts. No negative ruminations. No obsession.    Suicidal Thoughts:  No  Homicidal Thoughts:  No  Memory:  Immediate;   Good Recent;   Fair Remote;   Fair  Judgement:  Fair  Insight:  Present  Psychomotor Activity:  Decreased  Concentration:  Concentration: Fair and Attention Span: Fair  Recall:  [employer]  Fund of Knowledge:  Fair  Language:  Good  Akathisia:  Negative  Handed:    AIMS (if indicated):     Assets:  Communication Skills Desire for Improvement Housing Physical  Health Resilience Social Support  ADL's:  Intact  Cognition:  WNL  Sleep:  Number of Hours: 5.5      COGNITIVE FEATURES THAT CONTRIBUTE TO RISK:  None    SUICIDE RISK:   Mild:  Suicidal ideation of limited frequency, intensity, duration, and specificity.  There are no identifiable plans, no associated intent, mild dysphoria and related symptoms, good self-control (both objective and subjective assessment), few other risk factors, and identifiable protective factors, including available and accessible social support.  PLAN OF CARE:  1. Risperidone 2 mg HS. Could transition to LAI later.  2. Discontinue Citalopram 3. SW would gather collateral from his family 4. Team would follow on Monday  I certify that inpatient services furnished can reasonably be expected to improve the patient's condition.   Georgiann Cocker, MD 05/12/2017, 6:07 PM

## 2017-05-12 NOTE — Progress Notes (Signed)
Patient ID: David [name], male   DOB: 1995-05-22, 21 y.o.   MRN: Theressa [id number]    D: Pt has been very anxious and irritable on the unit today, he reported that he just had a lot on his mind. Pt reported that he was worried that [facility] that he would have no where to go. Pt then asked if he could have some shots so that he could just sleep his days away. This Clinical research associatewriter informed pt that that was not an option but that they would ask the doctor for medication to help with anxiety and agitation. Aggie NP made aware new orders noted to pts medication regimen. Pt reported that his depression was a 5, his hopelessness was a 5, and her anxiety was a 5. Pt reported that his goal for today was to be more focused and not as depressed. Pt reported being negative SI/HI, no AH/VH noted. A: 15 min checks continued for patient safety. R: Pt safety maintained.

## 2017-05-12 NOTE — H&P (Signed)
Psychiatric Admission Assessment Adult  Patient Identification: David [name]  MRN:  [id number]  Date of Evaluation:  05/12/2017  Chief Complaint:  schizoaffective disorder  Principal Diagnosis: Schizoaffective disorder, depressive type ([place])  Diagnosis:   Patient Active Problem List   Diagnosis Date Noted  . Schizoaffective disorder, depressive type ([place]) [F25.1] 05/11/2017    Priority: High  . Psychosis ([place]) [F29] 05/11/2017  . Vitamin D deficiency [E55.9] 01/07/2016  . Gastroesophageal reflux disease with esophagitis [K21.0] 08/31/2015  . Anxiety [F41.9] 08/31/2015  . Irritable bowel syndrome [K58.9] 08/30/2012  . Abdominal pain, chronic, epigastric [R10.13, G89.29] 08/02/2012  . Left ankle sprain [S93.402A] 08/02/2012   History of Present Illness:This is an admission assessment for this 22 year old Caucasian male with hx of previous mental illness. Admitted to the [facility] adult unit from the [facility] with complaints of suicidal ideations, auditory hallucinations & feeling of paranoia. Chart review indicated that patient had stopped taking his mental health medications for about 5 months because he was feeling better.  During this assessment, David 'Will' reports, "My grandmother took me to the hospital on Thursday, 2 days ago. I had asked her to take me. I was not feeling well emotionally & mentally. I had stopped taking my mental health medications 7 months prior. I thought that I was doing well & okay to not take those medicines. I thought I did not need them any more. I was taken Vraylar, Hydroxyzine & some other one. I just don't remember the doses. Prior to starting those medicines, my mood was wishy-washy all the time. I was not able to stay focused. I was diagnosed at age 75. I was told told I had Bipolar disorder & ADHD.  I was doing well on the medicines I was on, however, they made me feel zoned out like a zombie. I think that the medicines were too much for  me. Since stopping my medicines, my symptoms has gradually come back. It worsened around November, 2018. I also had suicidal thoughts for about days now. I did not attempt suicide, but my mother had & failed. She has Schizophrenia. I normally go to the [facility] for my medicines, but I have not been to [place] in a long time".  Associated Signs/Symptoms:  Depression Symptoms:  depressed mood, hopelessness, suicidal thoughts without plan, anxiety,  (Hypo) Manic Symptoms:  Hallucinations, Labiality of Mood,  Anxiety Symptoms:  Excessive Worry,  Psychotic Symptoms:  Hallucinations: Auditory Visual Paranoia,  PTSD Symptoms: Denies any PTSD symptoms or events.  Total Time spent with patient: 1 hour  Past Psychiatric History: ADHD, depression, anxiety, mood instability.  Is the patient at risk to self? No.  Has the patient been a risk to self in the past 6 months? No.  Has the patient been a risk to self within the distant past? No.  Is the patient a risk to others? No.  Has the patient been a risk to others in the past 6 months? No.  Has the patient been a risk to others within the distant past? No.   Prior Inpatient Therapy: Denies any inpatient psychiatric admissions.  Prior Outpatient Therapy: Yes, [facility].  Alcohol Screening: 1. How often do you have a drink containing alcohol?: Never 2. How many drinks containing alcohol do you have on a typical day when you are drinking?: 1 or 2 3. How often do you have six or more drinks on one occasion?: Never AUDIT-C Score: 0 9. Have you or someone else been injured  as a result of your drinking?: No 10. Has a relative or friend or a doctor or another health worker been concerned about your drinking or suggested you cut down?: No Alcohol Use Disorder Identification Test Final Score (AUDIT): 0 Intervention/Follow-up: AUDIT Score <7 follow-up not indicated  Substance Abuse History in the last 12 months:  No.  Consequences of Substance  Abuse: NA  Previous Psychotropic Medications: Yes   Psychological Evaluations: No   Past Medical History: History reviewed. No pertinent past medical history.  Past Surgical History:  Procedure Laterality Date  . TYMPANOSTOMY TUBE PLACEMENT     Family History:  Family History  Problem Relation Age of Onset  . Mental illness Mother   . Heart attack Other    Family Psychiatric  History: Schizophrenia: Mother.  Tobacco Screening: Have you used any form of tobacco in the last 30 days? (Cigarettes, Smokeless Tobacco, Cigars, and/or Pipes): No  Social History:  Social History   Substance and Sexual Activity  Alcohol Use No     Social History   Substance and Sexual Activity  Drug Use No    Additional Social History: Patient is single, no children, unemployed, college students.  Allergies:  No Known Allergies  Lab Results:  Results for orders placed or performed during the hospital encounter of 05/10/17 (from the past 48 hour(s))  Urine rapid drug screen (hosp performed)     Status: None   Collection Time: 05/10/17  3:50 PM  Result Value Ref Range   Opiates NONE DETECTED NONE DETECTED   Cocaine NONE DETECTED NONE DETECTED   Benzodiazepines NONE DETECTED NONE DETECTED   Amphetamines NONE DETECTED NONE DETECTED   Tetrahydrocannabinol NONE DETECTED NONE DETECTED   Barbiturates NONE DETECTED NONE DETECTED    Comment: (NOTE) DRUG SCREEN FOR MEDICAL PURPOSES ONLY.  IF CONFIRMATION IS NEEDED FOR ANY PURPOSE, NOTIFY LAB WITHIN 5 DAYS. LOWEST DETECTABLE LIMITS FOR URINE DRUG SCREEN Drug Class                     Cutoff (ng/mL) Amphetamine and metabolites    1000 Barbiturate and metabolites    200 Benzodiazepine                 993 Tricyclics and metabolites     300 Opiates and metabolites        300 Cocaine and metabolites        300 THC                            50   CBC w/diff     Status: Abnormal   Collection Time: 05/10/17  4:17 PM  Result Value Ref Range   WBC  6.8 4.0 - 10.5 K/uL   RBC 5.77 4.22 - 5.81 MIL/uL   Hemoglobin 17.4 (H) 13.0 - 17.0 g/dL   HCT 51.0 39.0 - 52.0 %   MCV 88.4 78.0 - 100.0 fL   MCH 30.2 26.0 - 34.0 pg   MCHC 34.1 30.0 - 36.0 g/dL   RDW 12.4 11.5 - 15.5 %   Platelets 265 150 - 400 K/uL   Neutrophils Relative % 69 %   Neutro Abs 4.7 1.7 - 7.7 K/uL   Lymphocytes Relative 20 %   Lymphs Abs 1.4 0.7 - 4.0 K/uL   Monocytes Relative 9 %   Monocytes Absolute 0.6 0.1 - 1.0 K/uL   Eosinophils Relative 2 %   Eosinophils Absolute 0.2 0.0 -  0.7 K/uL   Basophils Relative 0 %   Basophils Absolute 0.0 0.0 - 0.1 K/uL  Comprehensive metabolic panel     Status: Abnormal   Collection Time: 05/10/17  4:17 PM  Result Value Ref Range   Sodium 136 135 - 145 mmol/L   Potassium 4.4 3.5 - 5.1 mmol/L   Chloride 101 101 - 111 mmol/L   CO2 26 22 - 32 mmol/L   Glucose, Bld 98 65 - 99 mg/dL   BUN 16 6 - 20 mg/dL   Creatinine, Ser 1.09 0.61 - 1.24 mg/dL   Calcium 9.6 8.9 - 10.3 mg/dL   Total Protein 7.8 6.5 - 8.1 g/dL   Albumin 5.0 3.5 - 5.0 g/dL   AST 16 15 - 41 U/L   ALT 15 (L) 17 - 63 U/L   Alkaline Phosphatase 58 38 - 126 U/L   Total Bilirubin 1.3 (H) 0.3 - 1.2 mg/dL   GFR calc non Af Amer >60 >60 mL/min   GFR calc Af Amer >60 >60 mL/min    Comment: (NOTE) The eGFR has been calculated using the CKD EPI equation. This calculation has not been validated in all clinical situations. eGFR's persistently <60 mL/min signify possible Chronic Kidney Disease.    Anion gap 9 5 - 15  Ethanol     Status: None   Collection Time: 05/10/17  4:17 PM  Result Value Ref Range   Alcohol, Ethyl (B) <10 <10 mg/dL    Comment:        LOWEST DETECTABLE LIMIT FOR SERUM ALCOHOL IS 10 mg/dL FOR MEDICAL PURPOSES ONLY   Salicylate level     Status: None   Collection Time: 05/10/17  4:17 PM  Result Value Ref Range   Salicylate Lvl <7.5 2.8 - 30.0 mg/dL  Acetaminophen level     Status: Abnormal   Collection Time: 05/10/17  4:17 PM  Result Value Ref  Range   Acetaminophen (Tylenol), Serum <10 (L) 10 - 30 ug/mL    Comment:        THERAPEUTIC CONCENTRATIONS VARY SIGNIFICANTLY. A RANGE OF 10-30 ug/mL MAY BE AN EFFECTIVE CONCENTRATION FOR MANY PATIENTS. HOWEVER, SOME ARE BEST TREATED AT CONCENTRATIONS OUTSIDE THIS RANGE. ACETAMINOPHEN CONCENTRATIONS >150 ug/mL AT 4 HOURS AFTER INGESTION AND >50 ug/mL AT 12 HOURS AFTER INGESTION ARE OFTEN ASSOCIATED WITH TOXIC REACTIONS.    Blood Alcohol level:  Lab Results  Component Value Date   ETH <10 91/63/8466   Metabolic Disorder Labs:  No results found for: HGBA1C, MPG No results found for: PROLACTIN No results found for: CHOL, TRIG, HDL, CHOLHDL, VLDL, LDLCALC  Current Medications: Current Facility-Administered Medications  Medication Dose Route Frequency Provider Last Rate Last Dose  . acetaminophen (TYLENOL) tablet 650 mg  650 mg Oral Q6H PRN [name], NP      . alum & mag hydroxide-simeth (MAALOX/MYLANTA) 200-200-20 MG/5ML suspension 30 mL  30 mL Oral Q6H PRN [name], NP      . citalopram (CELEXA) tablet 10 mg  10 mg Oral Daily [name], NP   10 mg at 05/12/17 0804  . ibuprofen (ADVIL,MOTRIN) tablet 600 mg  600 mg Oral Q8H PRN [name], NP      . magnesium hydroxide (MILK OF MAGNESIA) suspension 30 mL  30 mL Oral Daily PRN [name], NP      . ondansetron [facility]) tablet 4 mg  4 mg Oral Q8H PRN [name], NP      .  risperiDONE (RISPERDAL) tablet 1 mg  1 mg Oral BID [name], NP   1 mg at 05/12/17 6415   PTA Medications: Medications Prior to Admission  Medication Sig Dispense Refill Last Dose  . ibuprofen (ADVIL,MOTRIN) 200 MG tablet Take 200-400 mg by mouth daily as needed for moderate pain.   Past Month at Unknown time  . indomethacin (INDOCIN) 25 MG capsule Take 1 capsule (25 mg total) by mouth 3 (three) times daily with meals. (Patient not taking: Reported on 05/10/2017) 90 capsule 0 Not Taking at Unknown time  . Liniments  (SALONPAS EX) Apply 1 application topically daily as needed (PAIN).   Past Month at Unknown time  . Liniments (SALONPAS PAIN RELIEF PATCH EX) Apply 1 patch topically daily as needed.   Past Month at Unknown time  . Multiple Vitamins-Minerals (CENTRUM ADULTS PO) Take 1 tablet by mouth daily.   05/09/2017 at Unknown time  . naproxen sodium (ALEVE) 220 MG tablet Take 220 mg by mouth daily as needed (PAIN).   Past Month at Unknown time  . Vitamin D, Ergocalciferol, (DRISDOL) 50000 units CAPS capsule Take 1 capsule (50,000 Units total) by mouth every 7 (seven) days. (Patient not taking: Reported on 05/10/2017) 4 capsule 2 Not Taking at Unknown time   Musculoskeletal: Strength & Muscle Tone: within normal limits Gait & Station: normal Patient leans: N/A  Psychiatric Specialty Exam: Physical Exam  Constitutional: He appears well-developed and well-nourished.  HENT:  Head: Normocephalic.  Eyes: Pupils are equal, round, and reactive to light.  Neck: Normal range of motion.  Cardiovascular: Normal rate.  Respiratory: Effort normal.  GI: Soft.  Genitourinary:  Genitourinary Comments: Deferred  Musculoskeletal: Normal range of motion.  Neurological: He is alert.  Skin: Skin is warm.    Review of Systems  Constitutional: Negative.   HENT: Negative.   Eyes: Negative.   Respiratory: Negative.   Cardiovascular: Negative.   Gastrointestinal: Negative.   Genitourinary: Negative.   Musculoskeletal: Negative.   Skin: Negative.   Neurological: Negative.   Endo/Heme/Allergies: Negative.   Psychiatric/Behavioral: Positive for depression and hallucinations (Auditory/visual hallucinations). Negative for memory loss, substance abuse and suicidal ideas. The patient is nervous/anxious and has insomnia.     Blood pressure 101/69, pulse 89, temperature 97.6 F (36.4 C), temperature source Oral, resp. rate 16, height _0  (1.753 m), weight 72.1 kg (159 lb), SpO2 99 %.Body mass index is 23.48 kg/m.  General  Appearance: Casual, anxious, paranoid that others may harm him.  Eye Contact:  Fair  Speech:  Clear and Coherent and Slow  Volume:  Decreased  Mood:  Anxious and Depressed  Affect:  Congruent and Flat  Thought Process:  Coherent and Descriptions of Associations: Intact  Orientation:  Full (Time, Place, and Person)  Thought Content:  Hallucinations: Auditory Visual and Paranoid Ideation  Suicidal Thoughts:  Currently denies any thoughts, plans or intent.  Homicidal Thoughts:  Denies  Memory:  Immediate;   Good Recent;   Good Remote;   Good  Judgement:  Fair  Insight:  Fair  Psychomotor Activity:  Decreased  Concentration:  Concentration: Fair and Attention Span: Fair  Recall:  [employer] of Knowledge:  Fair  Language:  Good  Akathisia:  Negative  Handed:  Right  AIMS (if indicated):     Assets:  Communication Skills Desire for Improvement Social Support  ADL's:  Intact  Cognition:  WNL  Sleep:  Number of Hours: 5.5   Treatment Plan Summary: Daily contact with patient  to assess and evaluate symptoms and progress in treatment See MAR, Md's SRA & treatment plan.  Observation Level/Precautions:  15 minute checks  Laboratory:  Per ED  Psychotherapy: Group sessions   Medications: See MAR.  Consultations: As needed.   Discharge Concerns: Safety, mood stability.  Estimated LOS: 5-7 days  Other: Admit to the [facility].     Physician Treatment Plan for Primary Diagnosis: Schizoaffective disorder, depressive type ([place])  Long Term Goal(s): Improvement in symptoms so as ready for discharge  Short Term Goals: Ability to identify changes in lifestyle to reduce recurrence of condition will improve and Ability to disclose and discuss suicidal ideas  Physician Treatment Plan for Secondary Diagnosis: Principal Problem:   Schizoaffective disorder, depressive type ([place])  Long Term Goal(s): Improvement in symptoms so as ready for discharge  Short Term Goals: Ability to identify and  develop effective coping behaviors will improve, Compliance with prescribed medications will improve and Ability to identify triggers associated with substance abuse/mental health issues will improve  I certify that inpatient services furnished can reasonably be expected to improve the patient's condition.    Lindell Spar, NP, PMHNP, FNP-BC. 1/5/201911:20 AM

## 2017-05-12 NOTE — Progress Notes (Addendum)
Nursing Progress Note 1900-0730  D) Patient is isolative to his room but does make his needs known to staff. Patient bathed this evening and performed ADLs independently. Patient denies SI/HI at this time but does report hearing voices. Patient denies pain. Patient contracts for safety on the unit. Patient denies need for trazodone this evening. Patient asks Clinical research associatewriter, "do you think I will be discharged soon?"  A) Emotional support given. 1:1 interaction and active listening provided. Snacks and fluids provided. Opportunities for questions or concerns presented to patient. Patient encouraged to continue to work on treatment goals. Labs, vital signs and patient behavior monitored throughout shift. Patient safety maintained with q15 min safety checks. Low fall risk precautions in place and reviewed with patient; patient verbalized understanding.  R) Patient receptive to interaction with nurse. Patient remains safe on the unit at this time. Patient is resting in bed without complaints. Will continue to monitor.

## 2017-05-13 DIAGNOSIS — F22 Delusional disorders: Secondary | ICD-10-CM

## 2017-05-13 MED ORDER — RISPERIDONE 1 MG PO TABS
1.0000 mg | ORAL_TABLET | Freq: Once | ORAL | Status: AC
  Administered 2017-05-13: 1 mg via ORAL
  Filled 2017-05-13 (×2): qty 1

## 2017-05-13 NOTE — Progress Notes (Signed)
Nursing Progress Note: 7p-7a D: Pt currently presents with a anxious/preoccupied affect and behavior. Pt states "I hear random music sometimes. It can be really bothersome. I am so anxious. I can't focus on anything. My thoughts are racing." Interacting minimally with the milieu. Pt reports off and on sleep during the previous night with current medication regimen. Pt did attend wrap-up group.  A: Pt provided with medications per providers orders. Pt's labs and vitals were monitored throughout the night. Pt supported emotionally and encouraged to express concerns and questions. Pt educated on medications.  R: Pt's safety ensured with 15 minute and environmental checks. Pt currently denies SI, HI, and VH and endorses intermittent AH hearing music from no particular place. Pt verbally contracts to seek staff if SI,HI, or AVH occurs and to consult with staff before acting on any harmful thoughts. Will continue to monitor.

## 2017-05-13 NOTE — Plan of Care (Signed)
Patient verbalizes understanding of information, education provided. 

## 2017-05-13 NOTE — Progress Notes (Signed)
Nutrition Brief Note  Patient identified on the Malnutrition Screening Tool (MST) Report  Pt's weight has returned to usual weight in 2017.   Wt Readings from Last 15 Encounters:  05/11/17 159 lb (72.1 kg)  05/30/16 196 lb (88.9 kg)  03/29/16 197 lb 12.8 oz (89.7 kg) (91 %, Z= 1.35)*  02/15/16 201 lb 3.2 oz (91.3 kg) (93 %, Z= 1.44)*  12/23/15 199 lb 8 oz (90.5 kg) (92 %, Z= 1.42)*  10/28/15 162 lb (73.5 kg) (62 %, Z= 0.31)*  10/11/15 162 lb (73.5 kg) (62 %, Z= 0.32)*  10/07/15 162 lb (73.5 kg) (62 %, Z= 0.32)*  09/17/15 161 lb 2 oz (73.1 kg) (62 %, Z= 0.29)*  09/06/15 160 lb (72.6 kg) (60 %, Z= 0.25)*  08/30/15 160 lb 12.8 oz (72.9 kg) (61 %, Z= 0.29)*  08/09/15 160 lb (72.6 kg) (60 %, Z= 0.27)*  07/14/15 161 lb (73 kg) (62 %, Z= 0.31)*  05/17/15 158 lb 8 oz (71.9 kg) (60 %, Z= 0.24)*  12/10/14 156 lb (70.8 kg) (58 %, Z= 0.21)*   * Growth percentiles are based on CDC (Boys, 2-20 Years) data.    Body mass index is 23.48 kg/m. Patient meets criteria for normal based on current BMI.  Labs and medications reviewed.   No nutrition interventions warranted at this time. If nutrition issues arise, please consult RD.   Tilda FrancoLindsey Keldric Poyer, MS, RD, LDN [facility] Clinical Dietitian Pager: [phone] After Hours Pager: [phone]

## 2017-05-13 NOTE — Progress Notes (Signed)
D: Patient observed resting in quiet room this AM. Per staff, patient verbalized he was too afraid to stay in his room after new admit arrived. No roommate order was lifted yesterday however patient remains paranoid, fearful and worried both in affect and mood. Per self inventory and discussions with writer, rates depression at a 4/10, hopelessness at a 3/10 and anxiety at a 3/10. Rates sleep as fair, appetite as fair, energy as normal and concentration as good.  States goal for today is to "be more focused and talk to people here. Talk to more people in a general conversation." Denies pain, physical complaints.   A: Per orders, no medications scheduled at this time. No prns requested or required. Level III obs in place for safety. Emotional support offered and self inventory reviewed. Encouraged completion of Suicide Safety Plan and programming participation. Discussed POC with NP.   R: Patient verbalizes understanding of POC. Patient denies SI/HI/AVH and remains safe on level III obs. Will continue to monitor closely and make verbal contact frequently.

## 2017-05-13 NOTE — Progress Notes (Signed)
[facility] MD Progress Note  05/13/2017 11:51 AM David [name]  MRN:  [id number]  Subjective: David [name] reports, "I'm doing better today. I slept really well last night. I feel like I'm getting back into myself again".  Objective: David [name] is a 22 y.o Caucasian male, lives with his family. Known history of Schizoaffective disorder. Followed at [facility]. Patient has been off is medications for a couple of months. Psychosis has relapsed. He is paranoid as he feels there are people out to get him. He has not been able to function adequately. Patient has expressed desire to shoot self in the head. He agreed to voluntary admission. No substances. Patient reports auditory hallucinations. Says he cannot make out what they are saying.  Today, 05-13-17, David [name] is seen. Chart reviewed. Chart findings discussed with the treatment team. He presents alert, oriented & away of situation. Since being admitted yesterday, he has been in the quiet room because staff reported he was concerned about the other patients who are agitated or restless. He says today that he likes being in the quiet room for now. He is however visible on the unit, participating in the group sessions. He feels he is getting a lot better. He is sleeping well at night. His appetite is good. He does however report hearing voices still, he says he hears few of them, not as intense as prior to his admission. He denies any SIHI, delusional thoughts, but continue to show some paranoia tendencies. He is taking & tolerating his treatment regimen. Staff continue to provide support & encouragement.  Principal Problem: Schizoaffective disorder, depressive type (HCC)  Diagnosis:   Patient Active Problem List   Diagnosis Date Noted  . Schizoaffective disorder, depressive type (HCC) [F25.1] 05/11/2017    Priority: High  . Psychosis (HCC) [F29] 05/11/2017  . Vitamin D deficiency [E55.9] 01/07/2016  . Gastroesophageal reflux disease with esophagitis [K21.0] 08/31/2015  .  Anxiety [F41.9] 08/31/2015  . Irritable bowel syndrome [K58.9] 08/30/2012  . Abdominal pain, chronic, epigastric [R10.13, G89.29] 08/02/2012  . Left ankle sprain [S93.402A] 08/02/2012   Total Time spent with patient: 25 minutes  Past Psychiatric History: See H&P.  Past Medical History: History reviewed. No pertinent past medical history.  Past Surgical History:  Procedure Laterality Date  . TYMPANOSTOMY TUBE PLACEMENT     Family History:  Family History  Problem Relation Age of Onset  . Mental illness Mother   . Heart attack Other    Family Psychiatric  History: See H&P  Social History:  Social History   Substance and Sexual Activity  Alcohol Use No     Social History   Substance and Sexual Activity  Drug Use No    Social History   Socioeconomic History  . Marital status: Single    Spouse name: None  . Number of children: None  . Years of education: None  . Highest education level: None  Social Needs  . Financial resource strain: None  . Food insecurity - worry: None  . Food insecurity - inability: None  . Transportation needs - medical: None  . Transportation needs - non-medical: None  Occupational History  . None  Tobacco Use  . Smoking status: Never Smoker  . Smokeless tobacco: Never Used  Substance and Sexual Activity  . Alcohol use: No  . Drug use: No  . Sexual activity: No  Other Topics Concern  . None  Social History Narrative  . None   Additional Social History: Single, Archivist.  Sleep: Good  Appetite:  Good  Current Medications: Current Facility-Administered Medications  Medication Dose Route Frequency Provider Last Rate Last Dose  . acetaminophen (TYLENOL) tablet 650 mg  650 mg Oral Q6H PRN Charm RingsLord, Jamison Y, NP      . alum & mag hydroxide-simeth (MAALOX/MYLANTA) 200-200-20 MG/5ML suspension 30 mL  30 mL Oral Q6H PRN Charm RingsLord, Jamison Y, NP      . hydrOXYzine (ATARAX/VISTARIL) tablet 25 mg  25 mg Oral Q4H PRN Armandina StammerNwoko, Agnes I, NP       . ibuprofen (ADVIL,MOTRIN) tablet 600 mg  600 mg Oral Q8H PRN Charm RingsLord, Jamison Y, NP      . magnesium hydroxide (MILK OF MAGNESIA) suspension 30 mL  30 mL Oral Daily PRN Charm RingsLord, Jamison Y, NP      . ondansetron [facility](ZOFRAN) tablet 4 mg  4 mg Oral Q8H PRN Charm RingsLord, Jamison Y, NP      . risperiDONE (RISPERDAL) tablet 2 mg  2 mg Oral QHS [name], Vincent A, MD      . traZODone (DESYREL) tablet 50 mg  50 mg Oral QHS Armandina StammerNwoko, Agnes I, NP       Lab Results: No results found for this or any previous visit (from the past 48 hour(s)).  Blood Alcohol level:  Lab Results  Component Value Date   ETH <10 05/10/2017   Metabolic Disorder Labs: No results found for: HGBA1C, MPG No results found for: PROLACTIN No results found for: CHOL, TRIG, HDL, CHOLHDL, VLDL, LDLCALC  Physical Findings: AIMS: Facial and Oral Movements Muscles of Facial Expression: None, normal Lips and Perioral Area: None, normal Jaw: None, normal Tongue: None, normal,Extremity Movements Upper (arms, wrists, hands, fingers): None, normal Lower (legs, knees, ankles, toes): None, normal, Trunk Movements Neck, shoulders, hips: None, normal, Overall Severity Severity of abnormal movements (highest score from questions above): None, normal Incapacitation due to abnormal movements: None, normal Patient's awareness of abnormal movements (rate only patient's report): No Awareness, Dental Status Current problems with teeth and/or dentures?: No Does patient usually wear dentures?: No  CIWA:    COWS:     Musculoskeletal: Strength & Muscle Tone: within normal limits Gait & Station: normal Patient leans: N/A  Psychiatric Specialty Exam: Physical Exam  Nursing note and vitals reviewed.   Review of Systems  Neurological: Negative.   Endo/Heme/Allergies: Negative.   Psychiatric/Behavioral: Positive for depression ("Improving") and hallucinations (improving audiroy hallucinations). Negative for memory loss, substance abuse and suicidal ideas.  The patient is nervous/anxious ("Improving") and has insomnia ("Imrpoving").     Blood pressure (!) 102/53, pulse (!) 120, temperature 98.4 F (36.9 C), temperature source Oral, resp. rate 16, height 5\' 9"  (1.753 m), weight 72.1 kg (159 lb), SpO2 99 %.Body mass index is 23.48 kg/m.  General Appearance: Casually dressed, calm and cooperative. Good relatedness.   Eye Contact:  Good  Speech:  Clear and Coherent and Normal Rate  Volume:  Normal  Mood:  Feels better  Affect:  Blunted and mood congruent  Thought Process:  Linear  Orientation:  Full (Time, Place, and Person)  Thought Content:  Less hallucinations. Less paranoia. No preoccupation with violent thoughts. No negative ruminations. No obsession.    Suicidal Thoughts:  No  Homicidal Thoughts:  No  Memory:  Immediate;   Good Recent;   Fair Remote;   Fair  Judgement:  Fair  Insight:  Present  Psychomotor Activity:  Decreased  Concentration:  Concentration: Fair and Attention Span: Fair  Recall:  FiservFair  Fund of Knowledge:  Fair  Language:  Good  Akathisia:  Negative  Handed:    AIMS (if indicated):     Assets:  Communication Skills Desire for Improvement Housing Physical Health Resilience Social Support  ADL's:  Intact  Cognition:  WNL  Sleep:  Number of Hours: 6.25     Treatment Plan Summary: Daily contact with patient to assess and evaluate symptoms and progress in treatment: Patient is gradually approaching his baseline. David [name] continue inpatient hospitalizations.  David [name] continue today 05/13/2017 plan as below except where it is noted.  Schizoaffective disorder.     - Continue Risperdal 2 mg po Q hs.  Anxiety.      - Continue Hydroxyzine 25 mg po prn Q hs.  Insomnia.       - Continue Trazodone 50 mg po Q hs.        - David [name] obtain labs; EKG, HGBa1c, Prolactin level, Lipid panel & TSH.  Continue to encourage group milieu attendance & participation. Social worker to continue to work on the discharge  disposition.  Armandina Stammer, NP, PMHNP, FNP-BC 05/13/2017, 11:51 AM

## 2017-05-13 NOTE — [facility] Group Notes (Signed)
[facility]  Date/Time:  05/13/2017  11:00AM-12:00PM  Type of Therapy and Topic:  Group Therapy:  Music and Mood  Participation Level:  Active   Description of Group: In this process group, members listened to a variety of genres of music and identified that different types of music evoke different responses.  Patients were encouraged to identify music that was soothing for them and music that was energizing for them.  Patients discussed how this knowledge can help with wellness and recovery in various ways including managing depression and anxiety as well as encouraging healthy sleep habits.    Therapeutic Goals: 1. Patients will explore the impact of different varieties of music on mood 2. Patients will verbalize the thoughts they have when listening to different types of music 3. Patients will identify music that is soothing to them as well as music that is energizing to them 4. Patients will discuss how to use this knowledge to assist in maintaining wellness and recovery 5. Patients will explore the use of music as a coping skill  Summary of Patient Progress:  At the beginning of group, patient expressed he felt better since getting in the hospital and at the end of group said he felt better overall after listening to the music.  Therapeutic Modalities: Solution Focused Brief Therapy Activity   Ambrose MantleMareida Grossman-Orr, LCSW

## 2017-05-13 NOTE — Progress Notes (Signed)
Patient provided Risperdal per NP order. Patient reports Risperdal "helps a lot". Patient resting in quiet room. Will continue to monitor.

## 2017-05-13 NOTE — Progress Notes (Signed)
Patient given a roommate at [id number]. Patient immediately became anxious and started pacing the halls. Patient stated to writer, "I don't feel comfortable being in there with him. Can I sleep in that room?" (Referencing the quiet room). Patient encouraged to attempt to sleep in his room. Patient emotionally supported. Patient's safety on unit reinforced by staff. Patient continues to pace in his room and has not gotten into bed despite encouragement from staff. Patient appears paranoid and fearful. Patient allowed to sleep in the quiet room at this time. Door remains open. Will continue to monitor.

## 2017-05-13 NOTE — Progress Notes (Signed)
Patient is sitting on edge of bed in the quiet room in no acute distress. Patient refuses PRN trazodone and vistaril stating, "I'm alright, I'm just not sleepy". Patient appears watchful and anxious. Will continue to monitor.

## 2017-05-14 DIAGNOSIS — F29 Unspecified psychosis not due to a substance or known physiological condition: Secondary | ICD-10-CM

## 2017-05-14 DIAGNOSIS — R443 Hallucinations, unspecified: Secondary | ICD-10-CM

## 2017-05-14 LAB — HEMOGLOBIN A1C
Hgb A1c MFr Bld: 5.2 % (ref 4.8–5.6)
Mean Plasma Glucose: 102.54 mg/dL

## 2017-05-14 LAB — LIPID PANEL
CHOL/HDL RATIO: 3 ratio
Cholesterol: 112 mg/dL (ref 0–200)
HDL: 37 mg/dL — ABNORMAL LOW (ref >40)
LDL Cholesterol: 66 mg/dL (ref 0–99)
TRIGLYCERIDES: 44 mg/dL (ref <150)
VLDL: 9 mg/dL (ref 0–40)

## 2017-05-14 LAB — TSH: TSH: 1.481 u[IU]/mL (ref 0.350–4.500)

## 2017-05-14 MED ORDER — PALIPERIDONE ER 6 MG PO TB24
6.0000 mg | ORAL_TABLET | Freq: Every day | ORAL | Status: DC
  Administered 2017-05-14 – 2017-05-19 (×6): 6 mg via ORAL
  Filled 2017-05-14 (×9): qty 1

## 2017-05-14 NOTE — Progress Notes (Signed)
DAR NOTE: Patient presents with anxious affect and guarded.  Denies pain, auditory and visual hallucinations.  Described energy level as high and concentration as good.  Rates depression at 3, hopelessness at 2, and anxiety at 4.  Maintained on routine safety checks.  Medications given as prescribed.  Support and encouragement offered as needed.  Attended group and participated.  States goal for today is "continue to stay focused and find something to do."  Patient is withdrawn and isolates to his room.  Minimal interaction with staff and peers.  Offered no complaint.

## 2017-05-14 NOTE — Tx Team (Signed)
Interdisciplinary Treatment and Diagnostic Plan Update  05/14/2017 Time of Session: 8:46 AM  David [name] MRN: [id number]  Principal Diagnosis: Schizoaffective disorder, depressive type [name])  Secondary Diagnoses: Principal Problem:   Schizoaffective disorder, depressive type ([name])   Current Medications:  Current Facility-Administered Medications  Medication Dose Route Frequency Provider Last Rate Last Dose  . acetaminophen (TYLENOL) tablet 650 mg  650 mg Oral Q6H PRN [name], NP      . alum & mag hydroxide-simeth (MAALOX/MYLANTA) 200-200-20 MG/5ML suspension 30 mL  30 mL Oral Q6H PRN [name], NP      . hydrOXYzine (ATARAX/VISTARIL) tablet 25 mg  25 mg Oral Q4H PRN [name], NP      . ibuprofen (ADVIL,MOTRIN) tablet 600 mg  600 mg Oral Q8H PRN [name], NP      . magnesium hydroxide (MILK OF MAGNESIA) suspension 30 mL  30 mL Oral Daily PRN [name], NP      . ondansetron [facility]) tablet 4 mg  4 mg Oral Q8H PRN [name], NP      . risperiDONE (RISPERDAL) tablet 2 mg  2 mg Oral QHS [name], [name], MD   2 mg at 05/13/17 2108  . traZODone (DESYREL) tablet 50 mg  50 mg Oral QHS [name], NP   50 mg at 05/13/17 2107    PTA Medications: Medications Prior to Admission  Medication Sig Dispense Refill Last Dose  . ibuprofen (ADVIL,MOTRIN) 200 MG tablet Take 200-400 mg by mouth daily as needed for moderate pain.   Past Month at Unknown time  . indomethacin (INDOCIN) 25 MG capsule Take 1 capsule (25 mg total) by mouth 3 (three) times daily with meals. (Patient not taking: Reported on 05/10/2017) 90 capsule 0 Not Taking at Unknown time  . Liniments (SALONPAS EX) Apply 1 application topically daily as needed (PAIN).   Past Month at Unknown time  . Liniments (SALONPAS PAIN RELIEF PATCH EX) Apply 1 patch topically daily as needed.   Past Month at Unknown time  . Multiple Vitamins-Minerals (CENTRUM ADULTS PO) Take 1 tablet by mouth daily.    05/09/2017 at Unknown time  . naproxen sodium (ALEVE) 220 MG tablet Take 220 mg by mouth daily as needed (PAIN).   Past Month at Unknown time  . Vitamin D, Ergocalciferol, (DRISDOL) 50000 units CAPS capsule Take 1 capsule (50,000 Units total) by mouth every 7 (seven) days. (Patient not taking: Reported on 05/10/2017) 4 capsule 2 Not Taking at Unknown time    Patient Stressors: Medication change or noncompliance  Patient Strengths: Dispensing optician for treatment/growth Physical Health Supportive family/friends  Treatment Modalities: Medication Management, Group therapy, Case management,  1 to 1 session with clinician, Psychoeducation, Recreational therapy.   Physician Treatment Plan for Primary Diagnosis: Schizoaffective disorder, depressive type ([name]) Long Term Goal(s): Improvement in symptoms so as ready for discharge  Short Term Goals: Ability to identify changes in lifestyle to reduce recurrence of condition David improve Ability to disclose and discuss suicidal ideas Ability to identify and develop effective coping behaviors David improve Compliance with prescribed medications David improve Ability to identify triggers associated with substance abuse/mental health issues David improve  Medication Management: Evaluate patient's response, side effects, and tolerance of medication regimen.  Therapeutic Interventions: 1 to 1 sessions, Unit Group sessions and Medication administration.  Evaluation of Outcomes: Progressing  Physician Treatment Plan for Secondary Diagnosis: Principal Problem:   Schizoaffective disorder, depressive type ([place])  Long Term Goal(s): Improvement in symptoms so as ready for discharge  Short Term Goals: Ability to identify changes in lifestyle to reduce recurrence of condition David improve Ability to disclose and discuss suicidal ideas Ability to identify and develop effective coping behaviors David improve Compliance with  prescribed medications David improve Ability to identify triggers associated with substance abuse/mental health issues David improve  Medication Management: Evaluate patient's response, side effects, and tolerance of medication regimen.  Therapeutic Interventions: 1 to 1 sessions, Unit Group sessions and Medication administration.  Evaluation of Outcomes: Progressing   RN Treatment Plan for Primary Diagnosis: Schizoaffective disorder, depressive type ([place]) Long Term Goal(s): Knowledge of disease and therapeutic regimen to maintain health David improve  Short Term Goals: Ability to identify and develop effective coping behaviors David improve and Compliance with prescribed medications David improve  Medication Management: RN David administer medications as ordered by provider, David assess and evaluate patient's response and provide education to patient for prescribed medication. RN David report any adverse and/or side effects to prescribing provider.  Therapeutic Interventions: 1 on 1 counseling sessions, Psychoeducation, Medication administration, Evaluate responses to treatment, Monitor vital signs and CBGs as ordered, Perform/monitor CIWA, COWS, AIMS and Fall Risk screenings as ordered, Perform wound care treatments as ordered.  Evaluation of Outcomes: Progressing   LCSW Treatment Plan for Primary Diagnosis: Schizoaffective disorder, depressive type ([place]) Long Term Goal(s): Safe transition to appropriate next level of care at discharge, Engage patient in therapeutic group addressing interpersonal concerns.  Short Term Goals: Engage patient in aftercare planning with referrals and resources  Therapeutic Interventions: Assess for all discharge needs, 1 to 1 time with Social worker, Explore available resources and support systems, Assess for adequacy in community support network, Educate family and significant other(s) on suicide prevention, Complete Psychosocial Assessment, Interpersonal group  therapy.  Evaluation of Outcomes: Met  Return home, follow up [place] in Treatment: Attending groups: Yes Participating in groups: Yes Taking medication as prescribed: Yes Toleration medication: Yes, no side effects reported at this time Family/Significant other contact made:No  Patient understands diagnosis: Yes AEB asking for help getting back on medications Discussing patient identified problems/goals with staff: Yes Medical problems stabilized or resolved: Yes Denies suicidal/homicidal ideation: Yes Issues/concerns per patient self-inventory: None Other: N/A  New problem(s) identified: None identified at this time.   New Short Term/Long Term Goal(s): "Get back on my meds"  Discharge Plan or Barriers:   Reason for Continuation of Hospitalization:  Depression Hallucinations  Medication stabilization   Estimated Length of Stay: 1/11  Attendees: Patient: David [name] 05/14/2017  8:46 AM  Physician: Maris Berger, MD 05/14/2017  8:46 AM  Nursing: Sena Hitch, RN 05/14/2017  8:46 AM  RN Care Manager: Lars Pinks, RN 05/14/2017  8:46 AM  Social Worker: Ripley Fraise 05/14/2017  8:46 AM  Recreational Therapist: Winfield Cunas 05/14/2017  8:46 AM  Other: Norberto Sorenson 05/14/2017  8:46 AM  Other:  05/14/2017  8:46 AM    Scribe for Treatment Team:  Roque Lias LCSW 05/14/2017 8:46 AM

## 2017-05-14 NOTE — BHH Group Notes (Signed)
LCSW Group Therapy Note  05/14/2017 1:15pm  Type of Therapy/Topic:  Group Therapy:  Balance in Life  Participation Level:  Active  Description of Group:    This group will address the concept of balance and how it feels and looks when one is unbalanced. Patients will be encouraged to process areas in their lives that are out of balance and identify reasons for remaining unbalanced. Facilitators will guide patients in utilizing problem-solving interventions to address and correct the stressor making their life unbalanced. Understanding and applying boundaries will be explored and addressed for obtaining and maintaining a balanced life. Patients will be encouraged to explore ways to assertively make their unbalanced needs known to significant others in their lives, using other group members and facilitator for support and feedback.  Therapeutic Goals: 1. Patient will identify two or more emotions or situations they have that consume much of in their lives. 2. Patient will identify signs/triggers that life has become out of balance:  3. Patient will identify two ways to set boundaries in order to achieve balance in their lives:  4. Patient will demonstrate ability to communicate their needs through discussion and/or role plays  Summary of Patient Progress:  Stayed the enitre time, engaged throughout.  Stated he is unblanced, "but getting better."  He knows he is unblanced because he is somewhat depressed, stressed and not feeling entirely confident.  States that trying to work problems out on his own, and routine of going to his Economistgrandfather's mechanic shop, are things that help him find balance    Therapeutic Modalities:   Engineer, manufacturing systemsCognitive Behavioral Therapy Solution-Focused Therapy Assertiveness Training  David [name], [place]CS[place] 05/14/2017 5:06 PM

## 2017-05-14 NOTE — Progress Notes (Signed)
Recreation Therapy Notes  INPATIENT RECREATION THERAPY ASSESSMENT  Patient Details Name: Theressa StampsWilliam M Richburg MRN: [id number] DOB: 1995/08/13 Today's Date: 05/14/2017  Patient Stressors: Family  Pt stated he was here because he was hearing things, paranoid, nervous, anxious and can't stay focused. Pt stated many of his family members has health issues which is a stressor.  Coping Skills:   Isolate, Avoidance, Exercise, Art/Dance, Talking, Sports, Music  Personal Challenges: Anger, Communication, Concentration, Decision-Making, Expressing Yourself, Relationships, Self-Esteem/Confidence, Social Interaction, Trusting Others  Leisure Interests (2+):  Sports - Basketball, Sports - Football, Exercise - Paediatric nurseLifting Weights, Sports - Exercise (Comment), Nature - Fishing, Individual - TV, Individual - Writing, Individual - Other (Comment)(Cardio, 4 wheeling, collect antiques)  Awareness of Community Resources:  Yes  Community Resources:  [place], Movie Theaters, [place], Other (Comment)(Stores)  Current Use: Yes  Patient Strengths:  Math; can do well when focused  Patient Identified Areas of Improvement:  Multi-tasking; Get more sleep  Current Recreation Participation:  3-4 times a week  Patient Goal for Hospitalization:  "Stay focused and maintain"  [place]idence[place]  [place]  Current [place] (including self-harm):  No  Current HI:  No  Consent to Intern Participation: N/A    Caroll RancherMarjette Lynnsie Linders, LRT/CTRS  Caroll RancherLindsay, Giuseppina Quinones A 05/14/2017, 12:51 PM

## 2017-05-14 NOTE — Progress Notes (Signed)
Recreation Therapy Notes  Date: 05/14/17 Time: 1000 Location: 500 Hall Dayroom  Group Topic: Communication  Goal Area(s) Addresses:  Patient will effectively communicate with peers in group.  Patient will verbalize benefit of healthy communication. Patient will verbalize positive effect of healthy communication on post d/c goals.  Patient will identify communication techniques that made activity effective for group.   Behavioral Response: Engaged  Intervention: Crown Holdingseometrical pictures, pencils, blank paper  Activity: Back to Back Drawings.  Each patient was given an opportunity to describe [name] picture to their peers.  The peers listening were to draw the picture as it was being described by the person talking.  Patients were unable to ask any clarifying questions of the person speaking.  Education: Communication, Discharge Planning  Education Outcome: Acknowledges understanding/In group clarification offered/Needs additional education.   Clinical Observations/Feedback: Pt was engaged and appropriate during group.  Pt stated communication can be done through eye contact and body language.  As the listener, pt stated one of his peer gave detailed instructions and as [name] result, there was no need to ask follow up questions.  As the speaker, pt stated he could have slowed down and given his peers time to draw the picture instead of rushing through it.  Pt explained in [name] real life conversation, he could ask questions for clarification and show them that he is listening and understands what is being stated.    David RancherMarjette Eilidh [name], LRT/CTRS      David AbedLindsay, David [name] 05/14/2017 11:20 AM

## 2017-05-14 NOTE — Progress Notes (Signed)
[facility] MD Progress Note  05/14/2017 4:45 PM SAGAN MASELLI  MRN:  [id number] Subjective:    David [name] is a 22 y/o M with history of schizoaffective disorder depressive type who was admitted with worsening symptoms of psychosis and anxiety in the context of poor medication adherence for the past 6 months. Pt was restarted on risperdal, and he has been observed on the inpatient psychiatry unit.  Today upon evaluation, pt remains anxious and guarded. He shares about his reasons for admission, "I was nervous and paranoid - I thought someone was going to hurt me." He reports being off his medications for about 6-7 months. He notes his symptoms have improved significantly since admission and being restarted on medications, which he has been tolerating without difficulty or side effects. In regards to [facility], pt reports he is still experiencing them but "they have gone down a lot" and he rates them an intensity of "3/10" today. He denies SI/HI/VH.  Discussed with patient about treatment options. He discussed option of long-acting injectable with admitting physician, and he is open to that idea. We discussed changing risperdal to similar medication of invega today to test a dose of oral formulation before transition to [place], and pt was in agreement with that plan. He had no further questions, comments, or concerns.  Principal Problem: Schizoaffective disorder, depressive type (HCC) Diagnosis:   Patient Active Problem List   Diagnosis Date Noted  . Schizoaffective disorder, depressive type (HCC) [F25.1] 05/11/2017  . Psychosis (HCC) [F29] 05/11/2017  . Vitamin D deficiency [E55.9] 01/07/2016  . Gastroesophageal reflux disease with esophagitis [K21.0] 08/31/2015  . Anxiety [F41.9] 08/31/2015  . Irritable bowel syndrome [K58.9] 08/30/2012  . Abdominal pain, chronic, epigastric [R10.13, G89.29] 08/02/2012  . Left ankle sprain [S93.402A] 08/02/2012   Total Time spent with patient:  30 minutes  Past Psychiatric History: see H&P  Past Medical History: History reviewed. No pertinent past medical history.  Past Surgical History:  Procedure Laterality Date  . TYMPANOSTOMY TUBE PLACEMENT     Family History:  Family History  Problem Relation Age of Onset  . Mental illness Mother   . Heart attack Other    Family Psychiatric  History: see H&P Social History:  Social History   Substance and Sexual Activity  Alcohol Use No     Social History   Substance and Sexual Activity  Drug Use No    Social History   Socioeconomic History  . Marital status: Single    Spouse name: None  . Number of children: None  . Years of education: None  . Highest education level: None  Social Needs  . Financial resource strain: None  . Food insecurity - worry: None  . Food insecurity - inability: None  . Transportation needs - medical: None  . Transportation needs - non-medical: None  Occupational History  . None  Tobacco Use  . Smoking status: Never Smoker  . Smokeless tobacco: Never Used  Substance and Sexual Activity  . Alcohol use: No  . Drug use: No  . Sexual activity: No  Other Topics Concern  . None  Social History Narrative  . None   Additional Social History:                         Sleep: Good  Appetite:  Good  Current Medications: Current Facility-Administered Medications  Medication Dose Route Frequency Provider Last Rate Last Dose  . acetaminophen (TYLENOL) tablet 650 mg  650 mg Oral Q6H PRN Charm Rings, NP      . alum & mag hydroxide-simeth (MAALOX/MYLANTA) 200-200-20 MG/5ML suspension 30 mL  30 mL Oral Q6H PRN Charm Rings, NP      . hydrOXYzine (ATARAX/VISTARIL) tablet 25 mg  25 mg Oral Q4H PRN Armandina Stammer I, NP      . ibuprofen (ADVIL,MOTRIN) tablet 600 mg  600 mg Oral Q8H PRN Charm Rings, NP      . magnesium hydroxide (MILK OF MAGNESIA) suspension 30 mL  30 mL Oral Daily PRN Charm Rings, NP      . ondansetron  [facility]) tablet 4 mg  4 mg Oral Q8H PRN Charm Rings, NP      . paliperidone (INVEGA) 24 hr tablet 6 mg  6 mg Oral Daily Micheal Likens, MD      . traZODone (DESYREL) tablet 50 mg  50 mg Oral QHS Armandina Stammer I, NP   50 mg at 05/13/17 2107    Lab Results:  Results for orders placed or performed during the hospital encounter of 05/11/17 (from the past 48 hour(s))  Lipid panel     Status: Abnormal   Collection Time: 05/14/17  6:22 AM  Result Value Ref Range   Cholesterol 112 0 - 200 mg/dL   Triglycerides 44 <161 mg/dL   HDL 37 (L) >09 mg/dL   Total CHOL/HDL Ratio 3.0 RATIO   VLDL 9 0 - 40 mg/dL   LDL Cholesterol 66 0 - 99 mg/dL    Comment:        Total Cholesterol/HDL:CHD Risk Coronary Heart Disease Risk Table                     Men   Women  1/2 Average Risk   3.4   3.3  Average Risk       5.0   4.4  2 X Average Risk   9.6   7.1  3 X Average Risk  23.4   11.0        Use the calculated Patient Ratio above and the CHD Risk Table to determine the patient's CHD Risk.        ATP III CLASSIFICATION (LDL):  <100     mg/dL   Optimal  604-540  mg/dL   Near or Above                    Optimal  130-159  mg/dL   Borderline  981-191  mg/dL   High  >478     mg/dL   Very High Performed at [facility], [address], [place], [place] [address]   Hemoglobin A1c     Status: None   Collection Time: 05/14/17  6:22 AM  Result Value Ref Range   Hgb A1c MFr Bld 5.2 4.8 - 5.6 %    Comment: (NOTE) Pre diabetes:          5.7%-6.4% Diabetes:              >6.4% Glycemic control for   <7.0% adults with diabetes    Mean Plasma Glucose 102.54 mg/dL    Comment: Performed at [facility], [address], [place], [place] [address]  TSH     Status: None   Collection Time: 05/14/17  6:22 AM  Result Value Ref Range   TSH 1.481 0.350 - 4.500 uIU/mL    Comment: Performed by a 3rd Generation assay  with a functional sensitivity of <=0.01 uIU/mL. Performed at  [facility], [address] [place], [place] [address]     Blood Alcohol level:  Lab Results  Component Value Date   ETH <10 05/10/2017    Metabolic Disorder Labs: Lab Results  Component Value Date   HGBA1C 5.2 05/14/2017   MPG 102.54 05/14/2017   No results found for: PROLACTIN Lab Results  Component Value Date   CHOL 112 05/14/2017   TRIG 44 05/14/2017   HDL 37 (L) 05/14/2017   CHOLHDL 3.0 05/14/2017   VLDL 9 05/14/2017   LDLCALC 66 05/14/2017    Physical Findings: AIMS: Facial and Oral Movements Muscles of Facial Expression: None, normal Lips and Perioral Area: None, normal Jaw: None, normal Tongue: None, normal,Extremity Movements Upper (arms, wrists, hands, fingers): None, normal Lower (legs, knees, ankles, toes): None, normal, Trunk Movements Neck, shoulders, hips: None, normal, Overall Severity Severity of abnormal movements (highest score from questions above): None, normal Incapacitation due to abnormal movements: None, normal Patient's awareness of abnormal movements (rate only patient's report): No Awareness, Dental Status Current problems with teeth and/or dentures?: No Does patient usually wear dentures?: No  CIWA:    COWS:     Musculoskeletal: Strength & Muscle Tone: within normal limits Gait & Station: normal Patient leans: N/A  Psychiatric Specialty Exam: Physical Exam  Nursing note and vitals reviewed.   Review of Systems  Constitutional: Negative for chills and fever.  HENT: Negative for hearing loss.   Respiratory: Negative for cough.   Gastrointestinal: Negative for abdominal pain, heartburn, nausea and vomiting.  Psychiatric/Behavioral: Positive for hallucinations. Negative for depression and suicidal ideas. The patient is nervous/anxious.     Blood pressure 128/75, pulse (!) 110, temperature 98 F (36.7 C), temperature source Oral, resp. rate 16, height 5\' 9"  (1.753 m), weight 72.1 kg (159 lb), SpO2 99 %.Body mass  index is 23.48 kg/m.  General Appearance: Casual and Fairly Groomed  Eye Contact:  Good  Speech:  Clear and Coherent and Normal Rate  Volume:  Normal  Mood:  Anxious  Affect:  Appropriate, Congruent, Constricted and Flat  Thought Process:  Coherent and Goal Directed  Orientation:  Full (Time, Place, and Person)  Thought Content:  Hallucinations: Auditory  Suicidal Thoughts:  No  Homicidal Thoughts:  No  Memory:  Immediate;   Fair Recent;   Fair Remote;   Fair  Judgement:  Fair  Insight:  Fair  Psychomotor Activity:  Normal  Concentration:  Concentration: Fair  Recall:  Fis[employer]Fair  Fund of Knowledge:  Fair  Language:  Fair  Akathisia:  No  Handed:    AIMS (if indicated):     Assets:  Manufacturing systems engineerCommunication Skills Physical Health Resilience Social Support  ADL's:  Intact  Cognition:  WNL  Sleep:  Number of Hours: 5     Treatment Plan Summary: Daily contact with patient to assess and evaluate symptoms and progress in treatment and Medication management. Pt reports improvement of symptoms, but he still has anxiety and AH. He agrees to transition from risperdal to oral invega with plan to transition to [place] if he tolerates oral formulation.  - Continue inpatient hospitalization  - Schizoaffective disorder   - Discontinue risperdal  - Start Invega 6mg  po qDay  - Anxiety  - Continue atarax 25mg  po q8h prn anxiety  -Insomnia   - Continue trazodone 50mg  po qhs prn insomnia  -Encourage participation in groups and therapeutic milieu -Discharge planning will be ongoing  Micheal Likens, MD 05/14/2017, 4:45 PM

## 2017-05-14 NOTE — Plan of Care (Signed)
  Safety: Periods of time without injury will increase 05/14/2017 2132 - Progressing by Delos HaringPhillips, Sydney Hasten A, RN Note Pt safe on the unit at this time

## 2017-05-14 NOTE — Plan of Care (Signed)
Patient is compliant with prescribed medications. 

## 2017-05-15 LAB — PROLACTIN: Prolactin: 30.7 ng/mL — ABNORMAL HIGH (ref 4.0–15.2)

## 2017-05-15 MED ORDER — MIRTAZAPINE 15 MG PO TBDP
15.0000 mg | ORAL_TABLET | Freq: Every evening | ORAL | Status: DC | PRN
  Administered 2017-05-17 – 2017-05-18 (×4): 15 mg via ORAL
  Filled 2017-05-15 (×4): qty 1

## 2017-05-15 MED ORDER — PALIPERIDONE PALMITATE 156 MG/ML IM SUSP
156.0000 mg | INTRAMUSCULAR | Status: DC
  Administered 2017-05-19: 156 mg via INTRAMUSCULAR
  Filled 2017-05-15: qty 1

## 2017-05-15 MED ORDER — PALIPERIDONE PALMITATE 234 MG/1.5ML IM SUSP
234.0000 mg | Freq: Once | INTRAMUSCULAR | Status: AC
  Administered 2017-05-15: 234 mg via INTRAMUSCULAR

## 2017-05-15 NOTE — [facility] Suicide Risk Assessment (Signed)
[facility] INPATIENT:  Family/Significant Other Suicide Prevention Education  Suicide Prevention Education:  Education Completed; David [name], grandmother, [phone] has been identified by the patient as the family member/significant other with whom the patient will be residing, and identified as the person(s) who will aid the patient in the event of a mental health crisis (suicidal ideations/suicide attempt).  With written consent from the patient, the family member/significant other has been provided the following suicide prevention education, prior to the and/or following the discharge of the patient.  The suicide prevention education provided includes the following:  Suicide risk factors  Suicide prevention and interventions  National Suicide Hotline telephone number  [facility] assessment telephone number  [facility] Emergency Assistance 911  [facility] and/or Residential Mobile Crisis Unit telephone number  Request made of family/significant other to:  Remove weapons (e.g., guns, rifles, knives), all items previously/currently identified as safety concern.    Remove drugs/medications (over-the-counter, prescriptions, illicit drugs), all items previously/currently identified as a safety concern.  The family member/significant other verbalizes understanding of the suicide prevention education information provided.  The family member/significant other agrees to remove the items of safety concern listed above. She states that all guns have been removed from the home by her husband. David [name] 05/15/2017, 2:10 PM

## 2017-05-15 NOTE — Progress Notes (Signed)
Recreation Therapy Notes  Date: 05/15/17 Time: 1000 Location: 500 Hall Dayroom  Group Topic: Self-Expression  Goal Area(s) Addresses:  Patient will successfully identify positive attributes about themselves.  Patient will successfully identify benefit of improved self-expression.   Behavioral Response: Engaged  Intervention: Black or brown Scientist, clinical (histocompatibility and immunogenetics)construction paper, black paint, crayons, paint brushes, straws  Activity: Scratch Art.  Patients were given a sheet of construction paper.  Patients were to take the crayons and color the paper with any colors, designs and shapes they chose.  Patients would then take the black paint and paint over their picture.  Lastly, patients would take a straw and scrap off the paint in any design they chose to reveal the colors underneath.   Education:  Self-Esteem, Building control surveyorDischarge Planning.   Education Outcome: Acknowledges education/In group clarification offered/Needs additional education  Clinical Observations/Feedback: Pt was engaged and quiet.  Pt stated he was feeling dizzy.  LRT encouraged pt to let doctor know how he was feeling. Pt worked quietly on his picture.  When finished, pt picture showed a basketball and few other designs.    Caroll RancherMarjette Annalysse Shoemaker, LRT/CTRS     Caroll RancherLindsay, Talan Gildner A 05/15/2017 11:36 AM

## 2017-05-15 NOTE — [facility] Group Notes (Signed)
Pt did not attend group. 

## 2017-05-15 NOTE — Progress Notes (Signed)
[facility]  05/15/2017 12:27 PM David StampsWilliam M Dossantos  MRN:  [id number] Subjective:    David NoaWilliam "Will" Rubye OaksDickerson is a 22 y/o M with history of schizoaffective disorder depressive type who was admitted with worsening symptoms of psychosis and anxiety in the context of poor medication adherence for the past 6 months. Pt was restarted on risperdal, and yesterday he was transitioned to [place] oral formulation with plan to transition to long-acting injectable form if pt tolerates oral form.  Today upon evaluation, pt states, "I'm good today." He continues to endorse some anxiety and paranoia that others are trying to hurt him, but he notes that it has significantly improved during his hospitalization. He states, "It's like 80 or 90 percent better today, and I'm not having any more voices." Pt denies SI/HI/AH/VH. His only complaint today is feeling slightly drowsy this morning, and we discussed that pt took two PRN medications of both atarax and trazodone at bedtime which may have contributed to his fatigue this morning. Pt verbalized good understanding, and he plans to only take one medication for help with initial insomnia if he has that concern tonight. Discussed with patient about invega, and he reports that he is tolerating it without difficulty or side effects. Pt is in agreement to start the [place] Sustenna injection today. He had no further questions, comments, or concerns.    Principal Problem: Schizoaffective disorder, depressive type (HCC) Diagnosis:   Patient Active Problem List   Diagnosis Date Noted  . Schizoaffective disorder, depressive type (HCC) [F25.1] 05/11/2017  . Psychosis (HCC) [F29] 05/11/2017  . Vitamin D deficiency [E55.9] 01/07/2016  . Gastroesophageal reflux disease with esophagitis [K21.0] 08/31/2015  . Anxiety [F41.9] 08/31/2015  . Irritable bowel syndrome [K58.9] 08/30/2012  . Abdominal pain, chronic, epigastric [R10.13, G89.29] 08/02/2012  . Left ankle sprain  [S93.402A] 08/02/2012   Total Time spent with patient: 30 minutes  Past Psychiatric History: see H&P  Past Medical History: History reviewed. No pertinent past medical history.  Past Surgical History:  Procedure Laterality Date  . TYMPANOSTOMY TUBE PLACEMENT     Family History:  Family History  Problem Relation Age of Onset  . Mental illness Mother   . Heart attack Other    Family Psychiatric  History: see H&P Social History:  Social History   Substance and Sexual Activity  Alcohol Use No     Social History   Substance and Sexual Activity  Drug Use No    Social History   Socioeconomic History  . Marital status: Single    Spouse name: None  . Number of children: None  . Years of education: None  . Highest education level: None  Social Needs  . Financial resource strain: None  . Food insecurity - worry: None  . Food insecurity - inability: None  . Transportation needs - medical: None  . Transportation needs - non-medical: None  Occupational History  . None  Tobacco Use  . Smoking status: Never Smoker  . Smokeless tobacco: Never Used  Substance and Sexual Activity  . Alcohol use: No  . Drug use: No  . Sexual activity: No  Other Topics Concern  . None  Social History Narrative  . None   Additional Social History:                         Sleep: Good  Appetite:  Good  Current Medications: Current Facility-Administered Medications  Medication Dose Route Frequency Provider Last Rate Last  Dose  . acetaminophen (TYLENOL) tablet 650 mg  650 mg Oral Q6H PRN Charm Rings, NP      . alum & mag hydroxide-simeth (MAALOX/MYLANTA) 200-200-20 MG/5ML suspension 30 mL  30 mL Oral Q6H PRN Charm Rings, NP      . hydrOXYzine (ATARAX/VISTARIL) tablet 25 mg  25 mg Oral Q4H PRN Armandina Stammer I, NP   25 mg at 05/14/17 2117  . ibuprofen (ADVIL,MOTRIN) tablet 600 mg  600 mg Oral Q8H PRN Charm Rings, NP      . magnesium hydroxide (MILK OF MAGNESIA)  suspension 30 mL  30 mL Oral Daily PRN Charm Rings, NP      . ondansetron (ZOFRAN) tablet 4 mg  4 mg Oral Q8H PRN Charm Rings, NP      . paliperidone (INVEGA SUSTENNA) injection 234 mg  234 mg Intramuscular Once Micheal Likens, MD       And  . Melene Muller ON 05/19/2017] paliperidone (INVEGA SUSTENNA) injection 156 mg  156 mg Intramuscular Q30 days Jolyne Loa T, MD      . paliperidone (INVEGA) 24 hr tablet 6 mg  6 mg Oral Daily Micheal Likens, MD   6 mg at 05/15/17 0807  . traZODone (DESYREL) tablet 50 mg  50 mg Oral QHS Armandina Stammer I, NP   50 mg at 05/14/17 2117    Lab Results:  Results for orders placed or performed during the hospital encounter of 05/11/17 (from the past 48 hour(s))  Lipid panel     Status: Abnormal   Collection Time: 05/14/17  6:22 AM  Result Value Ref Range   Cholesterol 112 0 - 200 mg/dL   Triglycerides 44 <604 mg/dL   HDL 37 (L) >54 mg/dL   Total CHOL/HDL Ratio 3.0 RATIO   VLDL 9 0 - 40 mg/dL   LDL Cholesterol 66 0 - 99 mg/dL    Comment:        Total Cholesterol/HDL:CHD Risk Coronary Heart Disease Risk Table                     Men   Women  1/2 Average Risk   3.4   3.3  Average Risk       5.0   4.4  2 X Average Risk   9.6   7.1  3 X Average Risk  23.4   11.0        Use the calculated Patient Ratio above and the CHD Risk Table to determine the patient's CHD Risk.        ATP III CLASSIFICATION (LDL):  <100     mg/dL   Optimal  098-119  mg/dL   Near or Above                    Optimal  130-159  mg/dL   Borderline  147-829  mg/dL   High  >562     mg/dL   Very High Performed at [facility], [address], [place], [place] [address]   Prolactin     Status: Abnormal   Collection Time: 05/14/17  6:22 AM  Result Value Ref Range   Prolactin 30.7 (H) 4.0 - 15.2 ng/mL    Comment: (NOTE) Performed At: [facility] [address] [place], [place] [id number] Jolene Schimke MD  [id number] Performed at [facility], [address] [place], [place] [address]   Hemoglobin A1c     Status: None  Collection Time: 05/14/17  6:22 AM  Result Value Ref Range   Hgb A1c MFr Bld 5.2 4.8 - 5.6 %    Comment: (NOTE) Pre diabetes:          5.7%-6.4% Diabetes:              >6.4% Glycemic control for   <7.0% adults with diabetes    Mean Plasma Glucose 102.54 mg/dL    Comment: Performed at [facility], [address] [place], [place] [address]  TSH     Status: None   Collection Time: 05/14/17  6:22 AM  Result Value Ref Range   TSH 1.481 0.350 - 4.500 uIU/mL    Comment: Performed by a 3rd Generation assay with a functional sensitivity of <=0.01 uIU/mL. Performed at [facility], [address], [place], [place] [address]     Blood Alcohol level:  Lab Results  Component Value Date   ETH <10 05/10/2017    Metabolic Disorder Labs: Lab Results  Component Value Date   HGBA1C 5.2 05/14/2017   MPG 102.54 05/14/2017   Lab Results  Component Value Date   PROLACTIN 30.7 (H) 05/14/2017   Lab Results  Component Value Date   CHOL 112 05/14/2017   TRIG 44 05/14/2017   HDL 37 (L) 05/14/2017   CHOLHDL 3.0 05/14/2017   VLDL 9 05/14/2017   LDLCALC 66 05/14/2017    Physical Findings: AIMS: Facial and Oral Movements Muscles of Facial Expression: None, normal Lips and Perioral Area: None, normal Jaw: None, normal Tongue: None, normal,Extremity Movements Upper (arms, wrists, hands, fingers): None, normal Lower (legs, knees, ankles, toes): None, normal, Trunk Movements Neck, shoulders, hips: None, normal, Overall Severity Severity of abnormal movements (highest score from questions above): None, normal Incapacitation due to abnormal movements: None, normal Patient's awareness of abnormal movements (rate only patient's report): No Awareness, Dental Status Current problems with teeth and/or dentures?: No Does patient  usually wear dentures?: No  CIWA:    COWS:     Musculoskeletal: Strength & Muscle Tone: within normal limits Gait & Station: normal Patient leans: N/A  Psychiatric Specialty Exam: Physical Exam  Nursing note and vitals reviewed.   Review of Systems  Constitutional: Negative for chills and fever.  Respiratory: Negative for cough.   Gastrointestinal: Negative for heartburn and nausea.  Psychiatric/Behavioral: Negative for depression, hallucinations and suicidal ideas. The patient is nervous/anxious. The patient does not have insomnia.     Blood pressure 132/73, pulse 100, temperature (!) 97.5 F (36.4 C), temperature source Oral, resp. rate 16, height 5\' 9"  (1.753 m), weight 72.1 kg (159 lb), SpO2 99 %.Body mass index is 23.48 kg/m.  General Appearance: Casual and Fairly Groomed  Eye Contact:  Good  Speech:  Clear and Coherent and Normal Rate  Volume:  Normal  Mood:  Anxious and Depressed  Affect:  Appropriate, Congruent and Flat  Thought Process:  Coherent and Goal Directed  Orientation:  Full (Time, Place, and Person)  Thought Content:  Ideas of Reference:   Paranoia and Paranoid Ideation  Suicidal Thoughts:  No  Homicidal Thoughts:  No  Memory:  Immediate;   Good Recent;   Good Remote;   Good  Judgement:  Fair  Insight:  Good  Psychomotor Activity:  Normal  Concentration:  Concentration: Fair  Recall:  [employer] of Knowledge:  Fair  Language:  Fair  Akathisia:  No  Handed:    AIMS (if indicated):     Assets:  Communication Skills Physical Health Resilience Social Support  ADL's:  Intact  Cognition:  WNL  Sleep:  Number of Hours: 6.25     Treatment Plan Summary: Daily contact with patient to assess and evaluate symptoms and progress in treatment and Medication management. Pt reports improvement of paranoia and resolution of AH today. He agrees to start [place] today and continue on oral formulation until outpatient follow up.  - Continue inpatient  hospitalization  - Schizoaffective disorder              - Continue Invega 6mg  po qDay   - Start Hinda Glatter Sustenna 234mg  IM once today (05/15/17) and plan to start [place] 156mg  IM q30 days in 7 days +/- 4 days  - Anxiety             - Continue atarax 25mg  po q8h prn anxiety  -Insomnia              - Continue trazodone 50mg  po qhs prn insomnia  -Encourage participation in groups and therapeutic milieu -Discharge planning will be ongoing    Micheal Likens, MD 05/15/2017, 12:27 PM

## 2017-05-15 NOTE — Progress Notes (Signed)
D: Pt denies SI/HI/VH. AH- less Pt is pleasant and cooperative. Pt stated he woke up this morning feeling like he had a hangover , pt informed it possibly was the Trazodone, pt stated he did not want to take it anymore.   A: Pt was offered support and encouragement. Pt was given scheduled medications. Pt was encourage to attend groups. Q 15 minute checks were done for safety.   R:Pt attends groups and interacts well with peers and staff. Pt is taking medication. Pt has no complaints.Pt receptive to treatment and safety maintained on unit.

## 2017-05-15 NOTE — Plan of Care (Signed)
  Safety: Periods of time without injury will increase 05/15/2017 2141 - Progressing by Delos HaringPhillips, Siddhartha Hoback A, RN Note Pt safe on the unit at this time   Coping: Ability to cope will improve 05/15/2017 2141 - Progressing by Delos HaringPhillips, Maeby Vankleeck A, RN Note Pt denies SI at this time

## 2017-05-15 NOTE — Plan of Care (Signed)
Patient is safe and free from injury.  Routine safety checks maintained every 15 minutes. 

## 2017-05-15 NOTE — Progress Notes (Signed)
DAR NOTE: Patient presents with anxious affect and mood.  Denies pain, suicidal thoughts,  auditory and visual hallucinations.  Reports less anxiety and feeling better overall.  Described energy level as normal and concentration as good.  Rates depression at 2, hopelessness at 2, and anxiety at 1.  Maintained on routine safety checks.  Medications given as prescribed.  Support and encouragement offered as needed.  Attended group and participated.  States goal for today is "to keep getting better and to relax."  Patient is withdrawn and isolates to his room.  Minimal interaction with staff and peers.    Hinda GlatterInvega Sustenna injection given.  No adverse reaction noted or reported.

## 2017-05-15 NOTE — Plan of Care (Signed)
  Safety: Periods of time without injury will increase 05/15/2017 2219 - Progressing by Delos HaringPhillips, Anitria Andon A, RN Note Pt safe on the unit at this time   Coping: Ability to cope will improve 05/15/2017 2141 - Progressing by Delos HaringPhillips, Rakeen Gaillard A, RN Note Pt denies SI at this time

## 2017-05-16 NOTE — Progress Notes (Signed)
Adult Psychoeducational Group Note  Date:  05/16/2017 Time:  9:00 PM  Group Topic/Focus:  Wrap-Up Group:   The focus of this group is to help patients review their daily goal of treatment and discuss progress on daily workbooks.  Participation Level:  Minimal  Participation Quality:  Appropriate  Affect:  Appropriate  Cognitive:  Oriented  Insight: Appropriate  Engagement in Group:  Engaged  Modes of Intervention:  Socialization and Support  Additional Comments:  Patient attended and participated in group tonight. He reports having a good day. He journal, cross word puzzle, relaxed peacefully and felt stress free.  Lita MainsFrancis, Michelle Wnek [facility] 05/16/2017, 9:00 PM

## 2017-05-16 NOTE — Progress Notes (Signed)
D: Pt denies SI/HI/AVH. Pt is pleasant and cooperative. Pt stated he had peaceful stress free day.   A: Pt was offered support and encouragement. Pt was given scheduled medications. Pt was encourage to attend groups. Q 15 minute checks were done for safety.   R:Pt attends groups and interacts well with peers and staff. Pt is taking medication. Pt has no complaints at this time .Pt receptive to treatment and safety maintained on unit.

## 2017-05-16 NOTE — Progress Notes (Signed)
Pt presents with a flat affect and anxious mood. Pt reported that he's feeling his best since getting on the right medications. Pt denies SI/HI, AVH and depression. Pt reports sleeping well at bedtime. Pt agreed to attend scheduled groups and meals in the cafeteria today. Pt appears to be less isolative today and observed interacting in the dayroom. Pt did attend morning group as scheduled. Medications administered as ordered per MD. Verbal support provided. Pt encouraged to attend groups. 15 minute checks performed for safety. Pt compliant with tx plan.

## 2017-05-16 NOTE — Progress Notes (Signed)
[facility] MD Progress Note  05/16/2017 1:50 PM CALUB TARNOW  MRN:  [id number] Subjective:    David [name] is a 22 y/o M with history of schizoaffective disorder depressive type who was admitted with worsening symptoms of psychosis and anxiety in the context of poor medication adherence for the past 6 months. Pt was restarted on risperdal initially, and then transitioned to [place] oral formulation and then yesterday he was started on long-acting injectable form [place].   Today upon evaluation, pt reports he is doing well overall. He shares, "I'm doing a lot better; the medicine is helping a lot." Pt reports resolution of paranoia and AH. He denies SI/HI/AH/VH. He is sleeping well. His appetite is good. He denies any side effects or difficulties with the medication. Pt shares that sometimes he has a brief moment of anxiety, but he is able to think about it and apply his coping skills. Pt would like to continue on his medication without changes today, and his plan is to discharge to his grandmother's home after receiving booster injection. Pt shares that it would be best for him to discharge over the weekend, so we will anticipate likely discharge to home on 05/19/17 after receiving second [place] booster injection.    Principal Problem: Schizoaffective disorder, depressive type (HCC) Diagnosis:   Patient Active Problem List   Diagnosis Date Noted  . Schizoaffective disorder, depressive type (HCC) [F25.1] 05/11/2017  . Psychosis (HCC) [F29] 05/11/2017  . Vitamin D deficiency [E55.9] 01/07/2016  . Gastroesophageal reflux disease with esophagitis [K21.0] 08/31/2015  . Anxiety [F41.9] 08/31/2015  . Irritable bowel syndrome [K58.9] 08/30/2012  . Abdominal pain, chronic, epigastric [R10.13, G89.29] 08/02/2012  . Left ankle sprain [S93.402A] 08/02/2012   Total Time spent with patient: 30 minutes  Past Psychiatric History: see H&P  Past Medical History: History  reviewed. No pertinent past medical history.  Past Surgical History:  Procedure Laterality Date  . TYMPANOSTOMY TUBE PLACEMENT     Family History:  Family History  Problem Relation Age of Onset  . Mental illness Mother   . Heart attack Other    Family Psychiatric  History: see H&P Social History:  Social History   Substance and Sexual Activity  Alcohol Use No     Social History   Substance and Sexual Activity  Drug Use No    Social History   Socioeconomic History  . Marital status: Single    Spouse name: None  . Number of children: None  . Years of education: None  . Highest education level: None  Social Needs  . Financial resource strain: None  . Food insecurity - worry: None  . Food insecurity - inability: None  . Transportation needs - medical: None  . Transportation needs - non-medical: None  Occupational History  . None  Tobacco Use  . Smoking status: Never Smoker  . Smokeless tobacco: Never Used  Substance and Sexual Activity  . Alcohol use: No  . Drug use: No  . Sexual activity: No  Other Topics Concern  . None  Social History Narrative  . None   Additional Social History:                         Sleep: Good  Appetite:  Good  Current Medications: Current Facility-Administered Medications  Medication Dose Route Frequency Provider Last Rate Last Dose  . acetaminophen (TYLENOL) tablet 650 mg  650 mg Oral Q6H PRN Charm Rings, NP      .  alum & mag hydroxide-simeth (MAALOX/MYLANTA) 200-200-20 MG/5ML suspension 30 mL  30 mL Oral Q6H PRN Charm Rings, NP   30 mL at 05/15/17 2308  . hydrOXYzine (ATARAX/VISTARIL) tablet 25 mg  25 mg Oral Q4H PRN Armandina Stammer I, NP   25 mg at 05/15/17 2208  . ibuprofen (ADVIL,MOTRIN) tablet 600 mg  600 mg Oral Q8H PRN Charm Rings, NP      . magnesium hydroxide (MILK OF MAGNESIA) suspension 30 mL  30 mL Oral Daily PRN Charm Rings, NP      . mirtazapine (REMERON SOL-TAB) disintegrating tablet 15 mg   15 mg Oral QHS PRN,MR X 1 Simon, Spencer E, PA-C      . ondansetron (ZOFRAN) tablet 4 mg  4 mg Oral Q8H PRN Charm Rings, NP      . Melene Muller ON 05/19/2017] paliperidone (INVEGA SUSTENNA) injection 156 mg  156 mg Intramuscular Q30 days Jolyne Loa T, MD      . paliperidone (INVEGA) 24 hr tablet 6 mg  6 mg Oral Daily Micheal Likens, MD   6 mg at 05/16/17 0717  . traZODone (DESYREL) tablet 50 mg  50 mg Oral QHS Armandina Stammer I, NP   50 mg at 05/14/17 2117    Lab Results: No results found for this or any previous visit (from the past 48 hour(s)).  Blood Alcohol level:  Lab Results  Component Value Date   ETH <10 05/10/2017    Metabolic Disorder Labs: Lab Results  Component Value Date   HGBA1C 5.2 05/14/2017   MPG 102.54 05/14/2017   Lab Results  Component Value Date   PROLACTIN 30.7 (H) 05/14/2017   Lab Results  Component Value Date   CHOL 112 05/14/2017   TRIG 44 05/14/2017   HDL 37 (L) 05/14/2017   CHOLHDL 3.0 05/14/2017   VLDL 9 05/14/2017   LDLCALC 66 05/14/2017    Physical Findings: AIMS: Facial and Oral Movements Muscles of Facial Expression: None, normal Lips and Perioral Area: None, normal Jaw: None, normal Tongue: None, normal,Extremity Movements Upper (arms, wrists, hands, fingers): None, normal Lower (legs, knees, ankles, toes): None, normal, Trunk Movements Neck, shoulders, hips: None, normal, Overall Severity Severity of abnormal movements (highest score from questions above): None, normal Incapacitation due to abnormal movements: None, normal Patient's awareness of abnormal movements (rate only patient's report): No Awareness, Dental Status Current problems with teeth and/or dentures?: No Does patient usually wear dentures?: No  CIWA:    COWS:     Musculoskeletal: Strength & Muscle Tone: within normal limits Gait & Station: normal Patient leans: N/A  Psychiatric Specialty Exam: Physical Exam  Nursing note and vitals reviewed.    Review of Systems  Constitutional: Negative for chills and fever.  Respiratory: Negative for cough.   Cardiovascular: Negative for chest pain.  Gastrointestinal: Negative for abdominal pain, heartburn, nausea and vomiting.  Psychiatric/Behavioral: Negative for depression, hallucinations and suicidal ideas. The patient is not nervous/anxious.     Blood pressure 140/74, pulse (!) 105, temperature 97.7 F (36.5 C), temperature source Oral, resp. rate 16, height 5\' 9"  (1.753 m), weight 72.1 kg (159 lb), SpO2 99 %.Body mass index is 23.48 kg/m.  General Appearance: Casual and Fairly Groomed  Eye Contact:  Good  Speech:  Clear and Coherent and Normal Rate  Volume:  Normal  Mood:  Euthymic  Affect:  Appropriate and Congruent  Thought Process:  Coherent and Goal Directed  Orientation:  Full (Time, Place, and Person)  Thought  Content:  Logical  Suicidal Thoughts:  No  Homicidal Thoughts:  No  Memory:  Immediate;   Good Recent;   Good Remote;   Good  Judgement:  Fair  Insight:  Fair  Psychomotor Activity:  Normal  Concentration:  Concentration: Fair  Recall:  [employer]  Fund of Knowledge:  Fair  Language:  Fair  Akathisia:  No  Handed:    AIMS (if indicated):     Assets:  Communication Skills Resilience Social Support  ADL's:  Intact  Cognition:  WNL  Sleep:  Number of Hours: 5.75     Treatment Plan Summary: Daily contact with patient to assess and evaluate symptoms and progress in treatment and Medication management. Pt reports improvement of paranoia and AH, and he is tolerating [place]vega Sustenna without difficulty or side effects. Pt anticipates receiving booster injection on 05/19/17 and discharging to his grandmother's home after booster injection if he continues to have symptom stability and improvement.   - Continue inpatient hospitalization  - Schizoaffective disorder - Continue Invega 6mg  po qDay             - Invega Sustenna 234mg  IM given yesterday (05/15/17)  and plan to give booster of [place]vega Sustenna 156mg  IM q30 days on 05/19/17  - Anxiety - Continue atarax 25mg  po q8h prn anxiety  -Insomnia - Continue trazodone 50mg  po qhs prn insomnia  -Encourage participation in groups and therapeutic milieu -Discharge planning will be ongoing  Micheal Likenshristopher T Zowie Lundahl, MD 05/16/2017, 1:50 PM

## 2017-05-16 NOTE — [facility] Group Notes (Signed)
LCSW Group Therapy Note   05/16/2017 1:15pm   Type of Therapy and Topic:  Group Therapy:  Overcoming Obstacles   Participation Level:  Active   Description of Group:    In this group patients will be encouraged to explore what they see as obstacles to their own wellness and recovery. They will be guided to discuss their thoughts, feelings, and behaviors related to these obstacles. The group will process together ways to cope with barriers, with attention given to specific choices patients can make. Each patient will be challenged to identify changes they are motivated to make in order to overcome their obstacles. This group will be process-oriented, with patients participating in exploration of their own experiences as well as giving and receiving support and challenge from other group members.   Therapeutic Goals: 1. Patient will identify personal and current obstacles as they relate to admission. 2. Patient will identify barriers that currently interfere with their wellness or overcoming obstacles.  3. Patient will identify feelings, thought process and behaviors related to these barriers. 4. Patient will identify two changes they are willing to make to overcome these obstacles:      Summary of Patient Progress   Stayed the entire time, engaged throughout.  Talked about deciding that more structure helps his confidence.  Other members helped him identify ways he could design more structure into his life.   Therapeutic Modalities:   Cognitive Behavioral Therapy Solution Focused Therapy Motivational Interviewing Relapse Prevention Therapy  Ida RogueRodney B Aquilla Voiles, LCSW 05/16/2017 3:44 PM

## 2017-05-16 NOTE — Plan of Care (Signed)
  Education: Mental status will improve 05/16/2017 2047 - Progressing by Delos HaringPhillips, Deissy Guilbert A, RN Note Pt stated he was feeling better   Safety: Periods of time without injury will increase 05/16/2017 2047 - Progressing by Delos HaringPhillips, Ozella Comins A, RN Note Pt safe on the unit at this time    Medication: Compliance with prescribed medication regimen will improve 05/16/2017 2047 - Progressing by Delos HaringPhillips, Val Schiavo A, RN Note Pt taking medications as prescribed

## 2017-05-16 NOTE — Progress Notes (Signed)
Recreation Therapy Notes  Date: 05/16/17 Time: 1000 Location: 500 Hall Dayroom  Group Topic: Anger Management  Goal Area(s) Addresses:  Patient will identify triggers for anger.  Patient will identify physical reaction to anger.   Patient will identify benefit of using coping skills when angry.  Behavioral Response: Engaged  Intervention: Worksheet  Activity: Intro to Anger Management.  LRT gave pt a worksheet on anger.  Patients were to identify at least 3 situations that lead to feelings of anger, ways they act differently when angry and problems caused by anger.  Education: Anger Management, Discharge Planning   Education Outcome: Acknowledges education/In group clarification offered/Needs additional education.   Clinical Observations/Feedback: Pt stated anger can be caused by stress, getting upset with yourself and anxiety.  Pt stated "anger is not a bad thing, it's just how you deal with it".  Pt stated the things that get him angry are "people yelling/cursing at me when they are mad, dealing with authority and different people".  Pt went on to say when he is angry, he likes to shout, journal, self talk, listen to music and watch sports.  Pt stated his anger has caused him to lose a lot of friends, damaged people's thoughts about him and his attitude.    Caroll RancherMarjette Saahil Herbster, LRT/CTRS          Caroll RancherLindsay, Dusty Raczkowski A 05/16/2017 12:10 PM

## 2017-05-17 MED ORDER — GABAPENTIN 100 MG PO CAPS
100.0000 mg | ORAL_CAPSULE | Freq: Three times a day (TID) | ORAL | Status: DC
  Administered 2017-05-17 – 2017-05-19 (×6): 100 mg via ORAL
  Filled 2017-05-17 (×13): qty 1

## 2017-05-17 NOTE — [facility] Group Notes (Signed)
[facility] Group Therapy  05/17/2017 , 11:08 AM    Type of Therapy:  [facility] Presentation  Participation Level:  Active  Participation Quality:  Attentive  Affect:  Blunted  Cognitive:  Oriented  Insight:  Limited  Engagement in Therapy:  Engaged  Modes of Intervention:  Discussion, Education and Socialization  Summary of Progress/Problems:  Tammi  from [facility] [employer] came to present her recovery story, encourage group  members to share something about their story, and present information about the [facility].  Stayed the entire time, engaged throughout.  Kelsee Preslar B 05/17/2017 , 11:08 AM   

## 2017-05-17 NOTE — Progress Notes (Signed)
Pt presents with a flat affect and depressive mood. Pt appears more depressed an anxious today compared to yesterday. Pt walks around the unit looking sad but denies that anything is wrong. Pt verbalized to writer this morning that he was feeling increasingly anxious and requested to take an antianxiety med. Pt stated that he didn't sleep well last night due to good and bad thoughts and feels really tired today. Pt denies SI/HI/AVH.  Medications reviewed with pt. Medications administered as ordered per MD. Verbal support provided. Pt encouraged to attend groups. 15 minute checks perform for safety. Pt compliant with tx.

## 2017-05-17 NOTE — Progress Notes (Signed)
Recreation Therapy Notes  Date: 05/17/17 Time: 1000 Location: 500 Hall Dayroom   Group Topic: Communication, Team Building, Problem Solving  Goal Area(s) Addresses:  Patient will effectively work with peer towards shared goal.  Patient will identify skills used to make activity successful.  Patient will identify how skills used during activity can be used to reach post d/c goals.   Behavioral Response: Engaged  Intervention: STEM Activity  Activity: Stage managerLanding Pad. In teams patients were given 12 plastic drinking straws and a length of masking tape. Using the materials provided patients were asked to build a landing pad to catch a golf ball dropped from approximately 6 feet in the air.   Education: Pharmacist, communityocial Skills, Discharge Planning   Education Outcome: Acknowledges education/In group clarification offered/Needs additional education.   Clinical Observations/Feedback: Pt stated his group used teamwork to complete the activity.  Pt stated this skill could be used with his support system by "talking to them so they can know what is going on with you so they can help you, if not they won't know how to help you".  Pt worked well with his peers and was very engaged in the activity.     Caroll RancherMarjette Zabella Wease, LRT/CTRS     Caroll RancherLindsay, Lashell Moffitt A 05/17/2017 12:01 PM

## 2017-05-17 NOTE — Tx Team (Signed)
Interdisciplinary Treatment and Diagnostic Plan Update  05/17/2017 Time of Session: 11:09 AM  ABEER IVERSEN MRN: [id number]  Principal Diagnosis: Schizoaffective disorder, depressive type [facility])  Secondary Diagnoses: Principal Problem:   Schizoaffective disorder, depressive type ([place])   Current Medications:  Current Facility-Administered Medications  Medication Dose Route Frequency Provider Last Rate Last Dose  . acetaminophen (TYLENOL) tablet 650 mg  650 mg Oral Q6H PRN [name], NP      . alum & mag hydroxide-simeth (MAALOX/MYLANTA) 200-200-20 MG/5ML suspension 30 mL  30 mL Oral Q6H PRN [name], NP   30 mL at 05/15/17 2308  . hydrOXYzine (ATARAX/VISTARIL) tablet 25 mg  25 mg Oral Q4H PRN [name], NP   25 mg at 05/17/17 1100  . ibuprofen (ADVIL,MOTRIN) tablet 600 mg  600 mg Oral Q8H PRN [name], NP      . magnesium hydroxide (MILK OF MAGNESIA) suspension 30 mL  30 mL Oral Daily PRN [name], NP      . mirtazapine (REMERON SOL-TAB) disintegrating tablet 15 mg  15 mg Oral QHS PRN,MR X 1 Patriciaann Clan E, PA-C   15 mg at 05/17/17 0019  . ondansetron (ZOFRAN) tablet 4 mg  4 mg Oral Q8H PRN [name], NP      . Derrill Memo ON 05/19/2017] paliperidone (INVEGA SUSTENNA) injection 156 mg  156 mg Intramuscular Q30 days Maris Berger T, MD      . paliperidone (INVEGA) 24 hr tablet 6 mg  6 mg Oral Daily Pennelope Bracken, MD   6 mg at 05/17/17 0754  . traZODone (DESYREL) tablet 50 mg  50 mg Oral QHS [name], NP   50 mg at 05/14/17 2117    PTA Medications: Medications Prior to Admission  Medication Sig Dispense Refill Last Dose  . ibuprofen (ADVIL,MOTRIN) 200 MG tablet Take 200-400 mg by mouth daily as needed for moderate pain.   Past Month at Unknown time  . indomethacin (INDOCIN) 25 MG capsule Take 1 capsule (25 mg total) by mouth 3 (three) times daily with meals. (Patient not taking: Reported on 05/10/2017) 90 capsule 0 Not Taking  at Unknown time  . Liniments (SALONPAS EX) Apply 1 application topically daily as needed (PAIN).   Past Month at Unknown time  . Liniments (SALONPAS PAIN RELIEF PATCH EX) Apply 1 patch topically daily as needed.   Past Month at Unknown time  . Multiple Vitamins-Minerals (CENTRUM ADULTS PO) Take 1 tablet by mouth daily.   05/09/2017 at Unknown time  . naproxen sodium (ALEVE) 220 MG tablet Take 220 mg by mouth daily as needed (PAIN).   Past Month at Unknown time  . Vitamin D, Ergocalciferol, (DRISDOL) 50000 units CAPS capsule Take 1 capsule (50,000 Units total) by mouth every 7 (seven) days. (Patient not taking: Reported on 05/10/2017) 4 capsule 2 Not Taking at Unknown time    Patient Stressors: Medication change or noncompliance  Patient Strengths: Dispensing optician for treatment/growth Physical Health Supportive family/friends  Treatment Modalities: Medication Management, Group therapy, Case management,  1 to 1 session with clinician, Psychoeducation, Recreational therapy.   Physician Treatment Plan for Primary Diagnosis: Schizoaffective disorder, depressive type ([place]) Long Term Goal(s): Improvement in symptoms so as ready for discharge  Short Term Goals: Ability to identify changes in lifestyle to reduce recurrence of condition will improve Ability to disclose and discuss suicidal ideas Ability to identify and develop effective coping behaviors will improve Compliance with prescribed medications  will improve Ability to identify triggers associated with substance abuse/mental health issues will improve  Medication Management: Evaluate patient's response, side effects, and tolerance of medication regimen.  Therapeutic Interventions: 1 to 1 sessions, Unit Group sessions and Medication administration.  Evaluation of Outcomes: Adequate for Discharge  Physician Treatment Plan for Secondary Diagnosis: Principal Problem:   Schizoaffective disorder, depressive  type ([place])   Long Term Goal(s): Improvement in symptoms so as ready for discharge  Short Term Goals: Ability to identify changes in lifestyle to reduce recurrence of condition will improve Ability to disclose and discuss suicidal ideas Ability to identify and develop effective coping behaviors will improve Compliance with prescribed medications will improve Ability to identify triggers associated with substance abuse/mental health issues will improve  Medication Management: Evaluate patient's response, side effects, and tolerance of medication regimen.  Therapeutic Interventions: 1 to 1 sessions, Unit Group sessions and Medication administration.  Evaluation of Outcomes: Adequate for Discharge   RN Treatment Plan for Primary Diagnosis: Schizoaffective disorder, depressive type ([place]) Long Term Goal(s): Knowledge of disease and therapeutic regimen to maintain health will improve  Short Term Goals: Ability to identify and develop effective coping behaviors will improve and Compliance with prescribed medications will improve  Medication Management: RN will administer medications as ordered by provider, will assess and evaluate patient's response and provide education to patient for prescribed medication. RN will report any adverse and/or side effects to prescribing provider.  Therapeutic Interventions: 1 on 1 counseling sessions, Psychoeducation, Medication administration, Evaluate responses to treatment, Monitor vital signs and CBGs as ordered, Perform/monitor CIWA, COWS, AIMS and Fall Risk screenings as ordered, Perform wound care treatments as ordered.  Evaluation of Outcomes: Adequate for Discharge   LCSW Treatment Plan for Primary Diagnosis: Schizoaffective disorder, depressive type ([place]) Long Term Goal(s): Safe transition to appropriate next level of care at discharge, Engage patient in therapeutic group addressing interpersonal concerns.  Short Term Goals: Engage patient in  aftercare planning with referrals and resources  Therapeutic Interventions: Assess for all discharge needs, 1 to 1 time with Social worker, Explore available resources and support systems, Assess for adequacy in community support network, Educate family and significant other(s) on suicide prevention, Complete Psychosocial Assessment, Interpersonal group therapy.  Evaluation of Outcomes: Met  Return home, follow up [place] in Treatment: Attending groups: Yes Participating in groups: Yes Taking medication as prescribed: Yes Toleration medication: Yes, no side effects reported at this time Family/Significant other contact made:No  Patient understands diagnosis: Yes AEB asking for help getting back on medications Discussing patient identified problems/goals with staff: Yes Medical problems stabilized or resolved: Yes Denies suicidal/homicidal ideation: Yes Issues/concerns per patient self-inventory: None Other: N/A  New problem(s) identified: None identified at this time.   New Short Term/Long Term Goal(s): "Get back on my meds"  Discharge Plan or Barriers:   Reason for Continuation of Hospitalization:  Medication stabilization   Estimated Length of Stay: D/C Saturday after getting second Invega injection  Attendees: Patient:  05/17/2017  11:09 AM  Physician: Maris Berger, MD 05/17/2017  11:09 AM  Nursing: Elesa Massed, RN 05/17/2017  11:09 AM  RN Care Manager: Lars Pinks, RN 05/17/2017  11:09 AM  Social Worker: Ripley Fraise 05/17/2017  11:09 AM  Recreational Therapist: Winfield Cunas 05/17/2017  11:09 AM  Other: Norberto Sorenson 05/17/2017  11:09 AM  Other:  05/17/2017  11:09 AM    Scribe for Treatment Team:  Roque Lias LCSW 05/17/2017 11:09 AM

## 2017-05-17 NOTE — [facility] Group Notes (Signed)
Adult Psychoeducational Group Note  Date:  05/17/2017 Time:  8:49 PM  Group Topic/Focus:  Wrap-Up Group:   The focus of this group is to help patients review their daily goal of treatment and discuss progress on daily workbooks.  Participation Level:  Active  Participation Quality:  Appropriate and Attentive  Affect:  Appropriate  Cognitive:  Alert and Appropriate  Insight: Appropriate and Good  Engagement in Group:  Engaged  Modes of Intervention:  Discussion  Additional Comments:  Pt attended and participate din wrap up group. Pt rated their day a 8.5 because they were stress free and relaxed from doing puzzles and talking to other people.   Chrisandra NettersOctavia A Delitha Elms 05/17/2017, 8:49 PM

## 2017-05-17 NOTE — Progress Notes (Signed)
[facility] MD Progress Note  05/17/2017 3:42 PM David [name]  MRN:  [id number]  Subjective: Will reports, "I feel a little anxious today. I want to do some type of activities. I'm tired of sitting around. I'm feeling a little hyper, fidgety & my mind is racing".   Objective: David [name] is a 22 y/o M with history of schizoaffective disorder depressive type who was admitted with worsening symptoms of psychosis and anxiety in the context of poor medication adherence for the past 6 months. Pt was restarted on risperdal initially, and then transitioned to [place] oral formulation and then yesterday he was started on long-acting injectable form [place].  Today 05-17-17,  upon evaluation, pt reports he is feeling a little anxious today. He shares, "my mind races during the day. I feel a little hyper, fidgety, wanting to do some kind of activities, tired of sitting around. The medicine is helping a lot." Pt reports resolution of paranoia and AH previously to the attending psychiatrist. He denies SI/HI/AH/VH. He is sleeping well. His appetite is good. He denies any side effects or difficulties with the medication.  Pt would like to continue on his medication without changes today, and his plan is to discharge to his grandmother's home after receiving booster injection. Pt shares that it would be best for him to discharge over the weekend, so we will anticipate likely discharge to home on 05/19/17 after receiving second [place] booster injection. Will add Gabapentin 100 mg tid.  Principal Problem: Schizoaffective disorder, depressive type (HCC) Diagnosis:   Patient Active Problem List   Diagnosis Date Noted  . Schizoaffective disorder, depressive type (HCC) [F25.1] 05/11/2017    Priority: High  . Psychosis (HCC) [F29] 05/11/2017  . Vitamin D deficiency [E55.9] 01/07/2016  . Gastroesophageal reflux disease with esophagitis [K21.0] 08/31/2015  . Anxiety [F41.9] 08/31/2015  .  Irritable bowel syndrome [K58.9] 08/30/2012  . Abdominal pain, chronic, epigastric [R10.13, G89.29] 08/02/2012  . Left ankle sprain [S93.402A] 08/02/2012   Total Time spent with patient: 15 minutes  Past Psychiatric History: see H&P  Past Medical History: History reviewed. No pertinent past medical history.  Past Surgical History:  Procedure Laterality Date  . TYMPANOSTOMY TUBE PLACEMENT     Family History:  Family History  Problem Relation Age of Onset  . Mental illness Mother   . Heart attack Other    Family Psychiatric  History: See H&P  Social History:  Social History   Substance and Sexual Activity  Alcohol Use No     Social History   Substance and Sexual Activity  Drug Use No    Social History   Socioeconomic History  . Marital status: Single    Spouse name: None  . Number of children: None  . Years of education: None  . Highest education level: None  Social Needs  . Financial resource strain: None  . Food insecurity - worry: None  . Food insecurity - inability: None  . Transportation needs - medical: None  . Transportation needs - non-medical: None  Occupational History  . None  Tobacco Use  . Smoking status: Never Smoker  . Smokeless tobacco: Never Used  Substance and Sexual Activity  . Alcohol use: No  . Drug use: No  . Sexual activity: No  Other Topics Concern  . None  Social History Narrative  . None   Additional Social History:   Sleep: Good  Appetite:  Good  Current Medications: Current Facility-Administered Medications  Medication Dose Route  Frequency Provider Last Rate Last Dose  . acetaminophen (TYLENOL) tablet 650 mg  650 mg Oral Q6H PRN Charm RingsLord, Jamison Y, NP      . alum & mag hydroxide-simeth (MAALOX/MYLANTA) 200-200-20 MG/5ML suspension 30 mL  30 mL Oral Q6H PRN Charm RingsLord, Jamison Y, NP   30 mL at 05/15/17 2308  . hydrOXYzine (ATARAX/VISTARIL) tablet 25 mg  25 mg Oral Q4H PRN Armandina StammerNwoko, Rayaan Garguilo I, NP   25 mg at 05/17/17 1100  . ibuprofen  (ADVIL,MOTRIN) tablet 600 mg  600 mg Oral Q8H PRN Charm RingsLord, Jamison Y, NP      . magnesium hydroxide (MILK OF MAGNESIA) suspension 30 mL  30 mL Oral Daily PRN Charm RingsLord, Jamison Y, NP      . mirtazapine (REMERON SOL-TAB) disintegrating tablet 15 mg  15 mg Oral QHS PRN,MR X 1 Donell SievertSimon, Spencer E, PA-C   15 mg at 05/17/17 0019  . ondansetron (ZOFRAN) tablet 4 mg  4 mg Oral Q8H PRN Charm RingsLord, Jamison Y, NP      . Melene Muller[START ON 05/19/2017] paliperidone (INVEGA SUSTENNA) injection 156 mg  156 mg Intramuscular Q30 days Jolyne Loaainville, Christopher T, MD      . paliperidone (INVEGA) 24 hr tablet 6 mg  6 mg Oral Daily Micheal Likensainville, Christopher T, MD   6 mg at 05/17/17 0754  . traZODone (DESYREL) tablet 50 mg  50 mg Oral QHS Armandina StammerNwoko, Yulitza Shorts I, NP   50 mg at 05/14/17 2117   Lab Results: No results found for this or any previous visit (from the past 48 hour(s)).  Blood Alcohol level:  Lab Results  Component Value Date   ETH <10 05/10/2017   Metabolic Disorder Labs: Lab Results  Component Value Date   HGBA1C 5.2 05/14/2017   MPG 102.54 05/14/2017   Lab Results  Component Value Date   PROLACTIN 30.7 (H) 05/14/2017   Lab Results  Component Value Date   CHOL 112 05/14/2017   TRIG 44 05/14/2017   HDL 37 (L) 05/14/2017   CHOLHDL 3.0 05/14/2017   VLDL 9 05/14/2017   LDLCALC 66 05/14/2017   Physical Findings: AIMS: Facial and Oral Movements Muscles of Facial Expression: None, normal Lips and Perioral Area: None, normal Jaw: None, normal Tongue: None, normal,Extremity Movements Upper (arms, wrists, hands, fingers): None, normal Lower (legs, knees, ankles, toes): None, normal, Trunk Movements Neck, shoulders, hips: None, normal, Overall Severity Severity of abnormal movements (highest score from questions above): None, normal Incapacitation due to abnormal movements: None, normal Patient's awareness of abnormal movements (rate only patient's report): No Awareness, Dental Status Current problems with teeth and/or dentures?:  No Does patient usually wear dentures?: No  CIWA:    COWS:     Musculoskeletal: Strength & Muscle Tone: within normal limits Gait & Station: normal Patient leans: N/A  Psychiatric Specialty Exam: Physical Exam  Nursing note and vitals reviewed.   Review of Systems  Constitutional: Negative for chills and fever.  Respiratory: Negative for cough.   Cardiovascular: Negative for chest pain.  Gastrointestinal: Negative for abdominal pain, heartburn, nausea and vomiting.  Psychiatric/Behavioral: Negative for depression, hallucinations and suicidal ideas. The patient is not nervous/anxious.     Blood pressure 133/73, pulse 84, temperature 98.1 F (36.7 C), temperature source Oral, resp. rate 16, height 5\' 9"  (1.753 m), weight 72.1 kg (159 lb), SpO2 99 %.Body mass index is 23.48 kg/m.  General Appearance: Casual and Fairly Groomed  Eye Contact:  Good  Speech:  Clear and Coherent and Normal Rate  Volume:  Normal  Mood:  Euthymic  Affect:  Appropriate and Congruent  Thought Process:  Coherent and Goal Directed  Orientation:  Full (Time, Place, and Person)  Thought Content:  Logical  Suicidal Thoughts:  No  Homicidal Thoughts:  No  Memory:  Immediate;   Good Recent;   Good Remote;   Good  Judgement:  Fair  Insight:  Fair  Psychomotor Activity:  Normal  Concentration:  Concentration: Fair  Recall:  Fair  Fund of Knowledge:  Fair  Language:  Fair  Akathisia:  No  Handed:    AIMS (if indicated):     Assets:  Communication Skills Resilience Social Support  ADL's:  Intact  Cognition:  WNL  Sleep:  Number of Hours: 4.25   Treatment Plan Summary: Daily contact with patient to assess and evaluate symptoms and progress in treatment and Medication management. Pt reports improvement of paranoia and AH, and he is tolerating [place] without difficulty or side effects. Pt anticipates receiving booster injection on 05/19/17 and discharging to his grandmother's home after booster  injection if he continues to have symptom stability and improvement.   - Continue inpatient hospitalization.  Will continue today 05/17/2017 plan as below except where it is noted.  - Schizoaffective disorder - Continue Invega 6mg  po qDay             - Invega Sustenna 234mg  IM given on (05/15/17) and plan to give booster of [place] 156mg  IM q30 days on 05/19/17  - Anxiety - Continue atarax 25mg  po q8h prn anxiety.             - Initiated gabapentin 100 mg tid po for agitation/fidgety.   -Insomnia - Continue trazodone 50mg  po qhs prn.  -Encourage participation in groups and therapeutic milieu -Discharge planning will be ongoing  Armandina Stammer, NP, PMHNP, FNP-BC. 05/17/2017, 3:42 PMPatient ID: Theressa Stamps, male   DOB: 06-Sep-1995, 21 y.o.   MRN: [id number]

## 2017-05-18 NOTE — Progress Notes (Signed)
Recreation Therapy Notes  Date: 05/18/17 Time: 1000 Location: 500 Hall Dayroom  Group Topic: Wellness  Goal Area(s) Addresses:  Patient will define components of whole wellness. Patient will verbalize benefit of whole wellness.  Intervention: 2 Decks of Cards, music  Activity: Deck of Chance.  Each patient was given two cards from one deck.  LRT would pull a card from the second deck.  If LRT pulled either of the patients numbers, patients would have to do the exercise that corresponds with that number.    Education: Wellness, Building control surveyorDischarge Planning.   Education Outcome: Acknowledges education/In group clarification offered/Needs additional education.   Clinical Observations/Feedback: Pt did not attend group.    Caroll RancherMarjette Thara Searing, LRT/CTRS     Caroll RancherLindsay, Bengie Kaucher A 05/18/2017 12:45 PM

## 2017-05-18 NOTE — [facility] Group Notes (Signed)
[facility] LCSW Group Therapy  05/18/2017  1:05 PM  Type of Therapy:  Group therapy  Participation Level:  Active  Participation Quality:  Attentive  Affect:  Flat  Cognitive:  Oriented  Insight:  Limited  Engagement in Therapy:  Limited  Modes of Intervention:  Discussion, Socialization  Summary of Progress/Problems:  Chaplain was here to lead a group on themes of hope and courage."I need patience to be hopeful.  As long as I keep working on it, I think I'll be OK.  I'm finding things take a lot longer than I was planning on."  Talked about learning from others-usually older people who have more wisdom.  David [name], David [name] 05/18/2017 1:33 PM

## 2017-05-18 NOTE — Progress Notes (Signed)
  [facility] Dis[facility] :  Will you be returning to the same living situation after discharge:  Yes,  home At discharge, do you have transportation home?: Yes,  family Do you have the ability to pay for your medications: Yes,  mental health  Release of information consent forms completed and in the chart;  Patient's signature needed at discharge.  Patient to Follow up at: Follow-up Information    [facility] Follow up on 05/21/2017.   Why:  Monday at 8:30 for your hospital follow up appointment.  Bring ID, insurance card and proof of insurance. Make sure to let them know you are due for your injection again on 06/17/17  Contact information: [address] [place] [id number] [phone]           Next level of care provider has access to [facility] Link:no  Safety Planning and Suicide Prevention discussed: Yes,  yes  Have you used any form of tobacco in the last 30 days? (Cigarettes, Smokeless Tobacco, Cigars, and/or Pipes): No  Has patient been referred to the Quitline?: N/A patient is not a smoker  Patient has been referred for addiction treatment: N/A  Ida RogueRodney B Oneal Schoenberger, LCSW 05/18/2017, 12:57 PM

## 2017-05-18 NOTE — Progress Notes (Signed)
DAR NOTE: Pt present with calm affect and depressed mood in the unit. Pt has been observed pacing back and forth between his room and dayroom. Pt denies physical pain, took all his meds as scheduled. As per self inventory, pt had a good night sleep, good appetite, normal energy, and good concentration. Pt rate depression at 1, hopeless ness at 1, and anxiety at 2. Pt's safety ensured with 15 minute and environmental checks. Pt currently denies SI/HI and A/V hallucinations. Pt verbally agrees to seek staff if SI/HI or A/VH occurs and to consult with staff before acting on these thoughts. Will continue POC.

## 2017-05-18 NOTE — Progress Notes (Signed)
Patient ID: David [name], male   DOB: Sep 10, 1995, 21 y.o.   MRN: [id number] DAR NOTE: Pt remained isolated to room for most of the night. Pt endorsed moderate anxiety; "I am a little anxious today." Denied SI, HI, Pain, depression or AVH. Pt was med compliant. All patient's questions and concerns addressed. Support, encouragement, and safe environment provided. 15-minute safety checks continue.  Pt did attend wrap-up group. Safety checks continue.

## 2017-05-18 NOTE — Progress Notes (Signed)
[facility]  05/18/2017 4:18 PM David [name]  MRN:  [id number] Subjective:    David [name] is a 22 y/o M with history of schizoaffective disorder depressive type who was admitted with worsening symptoms of psychosis and anxiety in the context of poor medication adherence for the past 6 months. Pt was restarted on risperdal initially,and then transitioned to [place] oral formulation and then long-acting injectable form [place]ga Sustenna. He has been reporting improvement of his symptoms overall during his stay.  Today upon evaluation, pt reports he is doing well overall, stating, "I'm doing pretty good." He denies SI/HI/AH/VH. He reports that symptoms of paranoia have completely resolved. He is sleeping well and his appetite is good. He is tolerating his medications without difficulty or side effects. He has been participating in groups and finds that they have been helpful. Discussed with patient about anticipated plan to discharge to his grandmother's home tomorrow if he tolerates booster injection and continues to have improvement of symptoms, and pt was in agreement with the above plan. He had no further questions, comments, or concerns.   Principal Problem: Schizoaffective disorder, depressive type (HCC) Diagnosis:   Patient Active Problem List   Diagnosis Date Noted  . Schizoaffective disorder, depressive type (HCC) [F25.1] 05/11/2017  . Psychosis (HCC) [F29] 05/11/2017  . Vitamin D deficiency [E55.9] 01/07/2016  . Gastroesophageal reflux disease with esophagitis [K21.0] 08/31/2015  . Anxiety [F41.9] 08/31/2015  . Irritable bowel syndrome [K58.9] 08/30/2012  . Abdominal pain, chronic, epigastric [R10.13, G89.29] 08/02/2012  . Left ankle sprain [S93.402A] 08/02/2012   Total Time spent with patient: 30 minutes  Past Psychiatric History: see H&P  Past Medical History: History reviewed. No pertinent past medical history.  Past Surgical History:  Procedure  Laterality Date  . TYMPANOSTOMY TUBE PLACEMENT     Family History:  Family History  Problem Relation Age of Onset  . Mental illness Mother   . Heart attack Other    Family Psychiatric  History: see H&P Social History:  Social History   Substance and Sexual Activity  Alcohol Use No     Social History   Substance and Sexual Activity  Drug Use No    Social History   Socioeconomic History  . Marital status: Single    Spouse name: None  . Number of children: None  . Years of education: None  . Highest education level: None  Social Needs  . Financial resource strain: None  . Food insecurity - worry: None  . Food insecurity - inability: None  . Transportation needs - medical: None  . Transportation needs - non-medical: None  Occupational History  . None  Tobacco Use  . Smoking status: Never Smoker  . Smokeless tobacco: Never Used  Substance and Sexual Activity  . Alcohol use: No  . Drug use: No  . Sexual activity: No  Other Topics Concern  . None  Social History Narrative  . None   Additional Social History:                         Sleep: Good  Appetite:  Good  Current Medications: Current Facility-Administered Medications  Medication Dose Route Frequency Provider Last Rate Last Dose  . acetaminophen (TYLENOL) tablet 650 mg  650 mg Oral Q6H PRN Charm RingsLord, Jamison Y, NP      . alum & mag hydroxide-simeth (MAALOX/MYLANTA) 200-200-20 MG/5ML suspension 30 mL  30 mL Oral Q6H PRN Charm RingsLord, Jamison Y, NP  30 mL at 05/15/17 2308  . gabapentin (NEURONTIN) capsule 100 mg  100 mg Oral TID Armandina Stammer I, NP   100 mg at 05/18/17 1119  . hydrOXYzine (ATARAX/VISTARIL) tablet 25 mg  25 mg Oral Q4H PRN Armandina Stammer I, NP   25 mg at 05/17/17 1100  . ibuprofen (ADVIL,MOTRIN) tablet 600 mg  600 mg Oral Q8H PRN Charm Rings, NP      . magnesium hydroxide (MILK OF MAGNESIA) suspension 30 mL  30 mL Oral Daily PRN Charm Rings, NP      . mirtazapine (REMERON SOL-TAB)  disintegrating tablet 15 mg  15 mg Oral QHS PRN,MR X 1 Kerry Hough, PA-C   15 mg at 05/17/17 2042  . ondansetron (ZOFRAN) tablet 4 mg  4 mg Oral Q8H PRN Charm Rings, NP      . Melene Muller ON 05/19/2017] paliperidone (INVEGA SUSTENNA) injection 156 mg  156 mg Intramuscular Q30 days Jolyne Loa T, MD      . paliperidone (INVEGA) 24 hr tablet 6 mg  6 mg Oral Daily Micheal Likens, MD   6 mg at 05/18/17 0818  . traZODone (DESYREL) tablet 50 mg  50 mg Oral QHS Armandina Stammer I, NP   50 mg at 05/14/17 2117    Lab Results: No results found for this or any previous visit (from the past 48 hour(s)).  Blood Alcohol level:  Lab Results  Component Value Date   ETH <10 05/10/2017    Metabolic Disorder Labs: Lab Results  Component Value Date   HGBA1C 5.2 05/14/2017   MPG 102.54 05/14/2017   Lab Results  Component Value Date   PROLACTIN 30.7 (H) 05/14/2017   Lab Results  Component Value Date   CHOL 112 05/14/2017   TRIG 44 05/14/2017   HDL 37 (L) 05/14/2017   CHOLHDL 3.0 05/14/2017   VLDL 9 05/14/2017   LDLCALC 66 05/14/2017    Physical Findings: AIMS: Facial and Oral Movements Muscles of Facial Expression: None, normal Lips and Perioral Area: None, normal Jaw: None, normal Tongue: None, normal,Extremity Movements Upper (arms, wrists, hands, fingers): None, normal Lower (legs, knees, ankles, toes): None, normal, Trunk Movements Neck, shoulders, hips: None, normal, Overall Severity Severity of abnormal movements (highest score from questions above): None, normal Incapacitation due to abnormal movements: None, normal Patient's awareness of abnormal movements (rate only patient's report): No Awareness, Dental Status Current problems with teeth and/or dentures?: No Does patient usually wear dentures?: No  CIWA:    COWS:     Musculoskeletal: Strength & Muscle Tone: within normal limits Gait & Station: normal Patient leans: N/A  Psychiatric Specialty  Exam: Physical Exam  Nursing note and vitals reviewed.   Review of Systems  Constitutional: Negative for chills and fever.  Respiratory: Negative for cough.   Cardiovascular: Negative for chest pain.  Gastrointestinal: Negative for heartburn and nausea.  Psychiatric/Behavioral: Negative for depression, hallucinations and suicidal ideas. The patient is not nervous/anxious.     Blood pressure 140/74, pulse 88, temperature 97.6 F (36.4 C), temperature source Oral, resp. rate 16, height 5\' 9"  (1.753 m), weight 72.1 kg (159 lb), SpO2 99 %.Body mass index is 23.48 kg/m.  General Appearance: Casual and Fairly Groomed  Eye Contact:  Fair  Speech:  Clear and Coherent and Normal Rate  Volume:  Normal  Mood:  Euthymic  Affect:  Appropriate and Congruent  Thought Process:  Coherent and Goal Directed  Orientation:  Full (Time, Place, and Person)  Thought  Content:  Logical  Suicidal Thoughts:  No  Homicidal Thoughts:  No  Memory:  Immediate;   Good Recent;   Good Remote;   Good  Judgement:  Fair  Insight:  Fair  Psychomotor Activity:  Normal  Concentration:  Concentration: Fair  Recall:  [employer] of Knowledge:  Fair  Language:  Fair  Akathisia:  No  Handed:    AIMS (if indicated):     Assets:  Manufacturing systems engineer Physical Health Resilience Social Support  ADL's:  Intact  Cognition:  WNL  Sleep:  Number of Hours: 6.75     Treatment Plan Summary: Daily contact with patient to assess and evaluate symptoms and progress in treatment and Medication management. Pt is doing well overall and he reports resolution of AH and paranoia. He is tolerating [place] without difficulty or side effects. He will receive booster injection tomorrow and likely discharge to his grandmother's home after that if his maintain symptom stability.   - Continue inpatient hospitalization  - Schizoaffective disorder -ContinueInvega 6mg  po qDay - Invega Sustenna 234mg  IM  given 05/15/17 and plan to give booster of [place] 156mg  IM q30 days on tomorrow AM 05/19/17  - Anxiety - Continue atarax 25mg  po q8h prn anxiety  -Insomnia - Continue trazodone 50mg  po qhs prn insomnia  -Encourage participation in groups and therapeutic milieu -Disposition tentative plan to DC to outpatient level of care on 05/19/17 after receiving booster injection of [place].   Micheal Likens, MD 05/18/2017, 4:18 PM

## 2017-05-19 MED ORDER — PALIPERIDONE ER 6 MG PO TB24: 6.0000 mg | ORAL_TABLET | Freq: Every day | ORAL | 0 refills | Status: DC

## 2017-05-19 MED ORDER — TRAZODONE HCL 50 MG PO TABS: 50.0000 mg | ORAL_TABLET | Freq: Every day | ORAL | 0 refills | Status: DC

## 2017-05-19 MED ORDER — HYDROXYZINE HCL 25 MG PO TABS: 25.0000 mg | ORAL_TABLET | ORAL | 0 refills | Status: DC | PRN

## 2017-05-19 MED ORDER — PALIPERIDONE PALMITATE 156 MG/ML IM SUSP: 156.0000 mg | INTRAMUSCULAR | 0 refills | Status: DC

## 2017-05-19 MED ORDER — GABAPENTIN 100 MG PO CAPS: 100.0000 mg | ORAL_CAPSULE | Freq: Three times a day (TID) | ORAL | 0 refills | Status: DC

## 2017-05-19 MED ORDER — MIRTAZAPINE 15 MG PO TBDP: 15.0000 mg | ORAL_TABLET | Freq: Every evening | ORAL | 0 refills | Status: DC | PRN

## 2017-05-19 NOTE — [facility] Group Notes (Signed)
[facility] LCSW Group Therapy Note  Date/Time:    05/19/2017   11:30 AM - 12:00 PM  Type of Therapy and Topic:  Group Therapy:  Thought Distortions  Participation Level:  Active   Description of Group:  In this group, patients were asked to describe the last time they got upset and what their thinking about the situation was at that time.  They were then introduced to cognitive distortions and discussed how these can negatively affect their lives.  This included only a few cognitive distortion types, including  All or Nothing thinking, Focusing on the Negative, Blaming, Predicting the Future and Overgeneralization.  Therapeutic Goals: 1. Patient will be able to identify this exercise of examining their thoughts as a healthy coping mechanism. 2. Patient will identify a problem area in their life and the automatic thoughts they have about that situation. 3. Patient will verbalize the feelings and outcomes typically associated with their thoughts. 4. Patient will think about and discuss other more realistic conclusions/thoughts that could be more helpful.  Summary of Patient Progress:  The patient expressed the last time he was upset was yesterday when he was irritated, and he used journaling, walking, and crosswords puzzles to calm himself down.  He was insightful about the different cognitive distortions, able to acknowledge using them and how they are harmful.  He seemed motivated for change, excited to discharge.   Therapeutic Modalities Cognitive Behavioral Therapy  Ambrose MantleMareida Grossman-Orr, LCSW 05/19/2017 12:00PM

## 2017-05-19 NOTE — Progress Notes (Signed)
D. Pt has been calm and cooperative with appropriate, but anxious behavior- observed attending group led by SW. Pt complaining of mild tremors - NP notified. Pt currently denies SI/HI and AVH  A. Labs and vitals monitored. Pt compliant with medications. Pt supported emotionally and encouraged to express concerns and ask questions.   R. Pt remains safe with 15 minute checks. Will continue POC.

## 2017-05-19 NOTE — Progress Notes (Signed)
Patient ID: David StampsWilliam M Tenenbaum, male   DOB: 11/16/1995, 21 y.o.   MRN: [id number] DAR Note: Pt endorses moderate anxiety and insomnia, "I will be leaving tomorrow, I'm anxious and I will be needing something strong for sleep." Pt denied SI, HI, Pain, depression or AVH. Pt was med compliant. All patient's questions and concerns addressed. Support, encouragement, and safe environment provided. 15-minute safety checks continue. Safety checks continue.

## 2017-05-19 NOTE — [facility] Suicide Risk Assessment (Signed)
[facility]   Principal Problem: Schizoaffective disorder, depressive type [facility](HCC) Discharge Diagnoses:  Patient Active Problem List   Diagnosis Date Noted  . Schizoaffective disorder, depressive type (HCC) [F25.1] 05/11/2017  . Psychosis (HCC) [F29] 05/11/2017  . Vitamin D deficiency [E55.9] 01/07/2016  . Gastroesophageal reflux disease with esophagitis [K21.0] 08/31/2015  . Anxiety [F41.9] 08/31/2015  . Irritable bowel syndrome [K58.9] 08/30/2012  . Abdominal pain, chronic, epigastric [R10.13, G89.29] 08/02/2012  . Left ankle sprain [S93.402A] 08/02/2012    Total Time spent with patient: 30 minutes  Musculoskeletal: Strength & Muscle Tone: within normal limits Gait & Station: normal Patient leans: N/A  Psychiatric Specialty Exam: ROS denies headache, no chest pain, no dyspnea, no fever   Blood pressure 123/75, pulse 76, temperature 97.7 F (36.5 C), temperature source Oral, resp. rate 18, height 5\' 9"  (1.753 m), weight 72.1 kg (159 lb), SpO2 99 %.Body mass index is 23.48 kg/m.  General Appearance: fairly groomed   Patent attorneyye Contact::  Good  Speech:  Normal Rate409  Volume:  monotone  Mood:  states mood is "OK", denies depression  Affect:  tends to be blunted, but improves during session and smiles at times appropriately   Thought Process:  Linear and Descriptions of Associations: Intact  Orientation:  Other:  fully alert and attentive   Thought Content:  reports hallucinations are "90%" better. States that he hears " helpful" voices at times, and denies command hallucinations, does not curently present internally preoccupied   Suicidal Thoughts:  No denies suicidal or self injurious ideations , denies homicidal or violent ideations  Homicidal Thoughts:  No  Memory:  recent and remote grossly intact   Judgement:  Other:  improving   Insight:  fair- improving   Psychomotor Activity:  Normal  Concentration:  Good  Recall:  Good  Fund of Knowledge:Good   Language: Good  Akathisia:  Negative  Handed:  Right  AIMS (if indicated):     Assets:  Communication Skills Desire for Improvement Resilience  Sleep:  Number of Hours: 5.5  Cognition: WNL  ADL's:  Intact   Mental Status Per Nursing Assessment::   On Admission:  Suicidal ideation indicated by patient, Suicide plan  Demographic Factors:  22 year old male , lives with grandparents   Loss Factors: Medication non compliance   Historical Factors: History of Schizoaffective D diagnosis  Risk Reduction Factors:   Living with another person, especially a relative, Positive social support and Positive coping skills or problem solving skills  Continued Clinical Symptoms:  At this time patient is alert, attentive, polite and cooperative on approach, calm, describes mood as improved , denies feeling depressed, affect blunted but does smile appropriately at times, no suicidal or self injurious ideations, no homicidal or violent ideations, states hallucinations have improved significantly, denies command hallucinations, does not appear internally preoccupied at present, and reports he is no longer feeling as paranoid . Future oriented, looking forward to discharge today. Denies medication side effects. No disruptive or agitated behaviors on unit. As per Dr. [place] ( Attending Psychiatrist) note, discharge was tentatively planned for today following booster dose of [place]vega Sustenna- patient denies medication side effects, reports significant improvement, and states he feels ready for discharge.  Cognitive Features That Contribute To Risk:  No gross cognitive deficits noted upon discharge. Is alert , attentive, and oriented x 3   Suicide Risk:  Mild:  Suicidal ideation of limited frequency, intensity, duration, and specificity.  There are no identifiable plans, no associated  intent, mild dysphoria and related symptoms, good self-control (both objective and subjective assessment), few other  risk factors, and identifiable protective factors, including available and accessible social support.  Follow-up Information    [facility] Follow up on 05/21/2017.   Why:  Monday at 8:30 for your hospital follow up appointment.  Bring ID, insurance card and proof of insurance. Make sure to let them know you are due for your injection Inveaga 117mg  again on 06/17/17  Contact information: [address] [place] [address] [phone]           Plan Of Care/Follow-up recommendations:  Activity:  inpatient treatment  Diet:  medications as below Tests:  NA Other:  See below  Patient is expressing readiness for discharge, plans to return to live with family  Patient is leaving unit in good spirits Follow up as above   Craige Cotta, MD 05/19/2017, 1:07 PM

## 2017-05-19 NOTE — Discharge Summary (Signed)
Physician Discharge Summary Note  Patient:  David [name] is an 22 y.o., male MRN:  [id number] DOB:  1995/12/11 Patient phone:  [phone] (home)  Patient address:   [address] [place] [address],  Total Time spent with patient: 30 minutes  Date of Admission:  05/11/2017 Date of Discharge: 05/19/2017  Reason for Admission: per admission assessment notes- 22 year old Caucasian male with hx of previous mental illness. [facility] with complaints of suicidal ideations, auditory hallucinations & feeling of paranoia. Chart review indicated that patient had stopped taking his mental health medications for about 5 months because he was feeling better. During this assessment, David NoaWilliam 'David [name]' reports, "My grandmother took me to the hospital on Thursday, 2 days ago. I had asked her to take me. I was not feeling well emotionally & mentally. I had stopped taking my mental health medications 7 months prior. I thought that I was doing well & okay to not take those medicines. I thought I did not need them any more. I was taken Vraylar, Hydroxyzine & some other one. I just don't remember the doses. Prior to starting those medicines, my mood was wishy-washy all the time. I was not able to stay focused. I was diagnosed at age 22. I was told told I had Bipolar disorder & ADHD.  I was doing well on the medicines I was on, however, they made me feel zoned out like a zombie. I think that the medicines were too much for me. Since stopping my medicines, my symptoms has gradually come back. It worsened around November, 2018. I also had suicidal thoughts for about days now. I did not attempt suicide, but my mother had & failed. She has Schizophrenia. [facility], but I have not been to [facility] in a long time".  Principal Problem: Schizoaffective disorder, depressive type [facility](HCC) Discharge Diagnoses: Patient Active Problem List    Diagnosis Date Noted  . Schizoaffective disorder, depressive type (HCC) [F25.1] 05/11/2017  . Psychosis (HCC) [F29] 05/11/2017  . Vitamin D deficiency [E55.9] 01/07/2016  . Gastroesophageal reflux disease with esophagitis [K21.0] 08/31/2015  . Anxiety [F41.9] 08/31/2015  . Irritable bowel syndrome [K58.9] 08/30/2012  . Abdominal pain, chronic, epigastric [R10.13, G89.29] 08/02/2012  . Left ankle sprain [S93.402A] 08/02/2012    Past Psychiatric History:   Past Medical History: History reviewed. No pertinent past medical history.  Past Surgical History:  Procedure Laterality Date  . TYMPANOSTOMY TUBE PLACEMENT     Family History:  Family History  Problem Relation Age of Onset  . Mental illness Mother   . Heart attack Other    Family Psychiatric  History:  Social History:  Social History   Substance and Sexual Activity  Alcohol Use No     Social History   Substance and Sexual Activity  Drug Use No    Social History   Socioeconomic History  . Marital status: Single    Spouse name: None  . Number of children: None  . Years of education: None  . Highest education level: None  Social Needs  . Financial resource strain: None  . Food insecurity - worry: None  . Food insecurity - inability: None  . Transportation needs - medical: None  . Transportation needs - non-medical: None  Occupational History  . None  Tobacco Use  . Smoking status: Never Smoker  . Smokeless tobacco: Never Used  Substance and Sexual Activity  .  Alcohol use: No  . Drug use: No  . Sexual activity: No  Other Topics Concern  . None  Social History Narrative  . None    Hospital Course:  David [name] was admitted for Schizoaffective disorder, depressive type [facility])  and crisis management.  Pt was treated discharged with the medications listed below under Medication List.  Medical problems were identified and treated as needed.  Home medications were restarted as appropriate.  Improvement  was monitored by observation and David [name] 's daily report of symptom reduction.  Emotional and mental status was monitored by daily self-inventory reports completed by David [name] and clinical staff.         David [name] was evaluated by the treatment team for stability and plans for continued recovery upon discharge. David [name] 's motivation was an integral factor for scheduling further treatment. Employment, transportation, bed availability, health status, family support, and any pending legal issues were also considered during hospital stay. Pt was offered further treatment options upon discharge including but not limited to Residential, Intensive Outpatient, and Outpatient treatment.  David [name] David [name] follow up with the services as listed below under Follow Up Information.     Upon completion of this admission the patient was both mentally and medically stable for discharge denying suicidal/homicidal ideation, reports auditory hallucination however states the voices are improving. denies visual or tactile hallucinations, delusional thoughts and paranoia.    David [name] responded well to treatment with Neurontin 100 mg, Remeron and Invega with monthly injection discussed. without adverse effects. Pt demonstrated improvement without reported or observed adverse effects to the point of stability appropriate for outpatient management. Pertinent labs include:  Prolactin 30.7 (high), for which outpatient follow-up is necessary for lab recheck as mentioned below. Reviewed CBC, CMP, BAL, and UDS; all unremarkable aside from noted exceptions.   Physical Findings: AIMS: Facial and Oral Movements Muscles of Facial Expression: None, normal Lips and Perioral Area: None, normal Jaw: None, normal Tongue: None, normal,Extremity Movements Upper (arms, wrists, hands, fingers): None, normal Lower (legs, knees, ankles, toes): None, normal, Trunk Movements Neck,  shoulders, hips: None, normal, Overall Severity Severity of abnormal movements (highest score from questions above): None, normal Incapacitation due to abnormal movements: None, normal Patient's awareness of abnormal movements (rate only patient's report): No Awareness, Dental Status Current problems with teeth and/or dentures?: No Does patient usually wear dentures?: No  CIWA:    COWS:     Musculoskeletal: Strength & Muscle Tone: within normal limits Gait & Station: normal Patient leans: N/A  Psychiatric Specialty Exam: See SRA by MD Physical Exam  Nursing note and vitals reviewed. Constitutional: He appears well-developed.  Neurological: He is alert.  Psychiatric: He has a normal mood and affect. His behavior is normal.    Review of Systems  Psychiatric/Behavioral: Positive for hallucinations. Negative for depression, substance abuse and suicidal ideas. The patient is not nervous/anxious and does not have insomnia.     Blood pressure 123/75, pulse 76, temperature 97.7 F (36.5 C), temperature source Oral, resp. rate 18, height 5\' 9"  (1.753 m), weight 72.1 kg (159 lb), SpO2 99 %.Body mass index is 23.48 kg/m.   Have you used any form of tobacco in the last 30 days? (Cigarettes, Smokeless Tobacco, Cigars, and/or Pipes): No  Has this patient used any form of tobacco in the last 30 days? (Cigarettes, Smokeless Tobacco, Cigars, and/or Pipes)  No  Blood Alcohol level:  Lab Results  Component Value Date  ETH <10 05/10/2017    Metabolic Disorder Labs:  Lab Results  Component Value Date   HGBA1C 5.2 05/14/2017   MPG 102.54 05/14/2017   Lab Results  Component Value Date   PROLACTIN 30.7 (H) 05/14/2017   Lab Results  Component Value Date   CHOL 112 05/14/2017   TRIG 44 05/14/2017   HDL 37 (L) 05/14/2017   CHOLHDL 3.0 05/14/2017   VLDL 9 05/14/2017   LDLCALC 66 05/14/2017    See Psychiatric Specialty Exam and Suicide Risk Assessment completed by Attending Physician  prior to discharge.  Discharge destination:  Home  Is patient on multiple antipsychotic therapies at discharge:  No   Has Patient had three or more failed trials of antipsychotic monotherapy by history:  No  Recommended Plan for Multiple Antipsychotic Therapies: NA  Discharge Instructions    Diet - low sodium heart healthy   Complete by:  As directed    Discharge instructions   Complete by:  As directed    Take all medications as prescribed. Keep all follow-up appointments as scheduled.  Do not consume alcohol or use illegal drugs while on prescription medications. Report any adverse effects from your medications to your primary care provider promptly.  In the event of recurrent symptoms or worsening symptoms, call 911, a crisis hotline, or go to the nearest emergency department for evaluation.   Increase activity slowly   Complete by:  As directed      Allergies as of 05/19/2017   No Known Allergies     Medication List    STOP taking these medications   CENTRUM ADULTS PO   ibuprofen 200 MG tablet Commonly known as:  ADVIL,MOTRIN   indomethacin 25 MG capsule Commonly known as:  INDOCIN   naproxen sodium 220 MG tablet Commonly known as:  ALEVE   SALONPAS EX   SALONPAS PAIN RELIEF PATCH EX   Vitamin D (Ergocalciferol) 50000 units Caps capsule Commonly known as:  DRISDOL     TAKE these medications     Indication  gabapentin 100 MG capsule Commonly known as:  NEURONTIN Take 1 capsule (100 mg total) by mouth 3 (three) times daily.  Indication:  Agitation   hydrOXYzine 25 MG tablet Commonly known as:  ATARAX/VISTARIL Take 1 tablet (25 mg total) by mouth every 4 (four) hours as needed for anxiety (Sleep).  Indication:  Feeling Anxious   mirtazapine 15 MG disintegrating tablet Commonly known as:  REMERON SOL-TAB Take 1 tablet (15 mg total) by mouth at bedtime as needed and may repeat dose one time if needed (insomnia).  Indication:  Major Depressive Disorder    paliperidone 156 MG/ML Susp injection Commonly known as:  INVEGA SUSTENNA Inject 1 mL (156 mg total) into the muscle every 30 (thirty) days. To be given by PCP ( primary care provider) or ACT teamt Start taking on:  06/19/2017  Indication:  Schizoaffective Disorder   paliperidone 6 MG 24 hr tablet Commonly known as:  INVEGA Take 1 tablet (6 mg total) by mouth daily. Start taking on:  05/20/2017  Indication:  Schizoaffective Disorder   traZODone 50 MG tablet Commonly known as:  DESYREL Take 1 tablet (50 mg total) by mouth at bedtime.  Indication:  Anxiety Disorder      Follow-up Information    Services, [facility] Follow up on 05/21/2017.   Why:  Monday at 8:30 for your hospital follow up appointment.  Bring ID, insurance card and proof of insurance. Make sure to let them know  you are due for your injection again on 06/17/17  Contact information: [address] [place] [address] [phone]           Follow-up recommendations:  Activity:  as tolerated Diet:  heart healthy Other:  Invega 117 monthly injection due 2/12  Comments:  Take all medications as prescribed. Keep all follow-up appointments as scheduled.  Do not consume alcohol or use illegal drugs while on prescription medications. Report any adverse effects from your medications to your primary care provider promptly.  In the event of recurrent symptoms or worsening symptoms, call 911, a crisis hotline, or go to the nearest emergency department for evaluation.   Signed: Oneta Rack, NP 05/19/2017, 10:46 AM

## 2017-05-19 NOTE — Progress Notes (Signed)
Pt discharged to lobby- Grandparents in lobby to pick up patient.Pt was stable and appreciative at that time. All papers and prescriptions were given and valuables returned. Discussed suicide safety assessment handout with pt. Verbal understanding expressed. Denies SI/HI and A/VH. Pt given opportunity to express concerns and ask questions.

## 2017-05-25 ENCOUNTER — Emergency Department (HOSPITAL_COMMUNITY)
Admission: EM | Admit: 2017-05-25 | Discharge: 2017-05-25 | Disposition: A | Discharge status: Home / Self Care | Payer: Medicaid Other | Attending: Emergency Medicine | Admitting: Emergency Medicine

## 2017-05-25 ENCOUNTER — Encounter (HOSPITAL_COMMUNITY): Payer: Self-pay

## 2017-05-25 ENCOUNTER — Emergency Department (HOSPITAL_COMMUNITY): Payer: Medicaid Other

## 2017-05-25 DIAGNOSIS — T7840XA Allergy, unspecified, initial encounter: Secondary | ICD-10-CM | POA: Diagnosis present

## 2017-05-25 DIAGNOSIS — R4781 Slurred speech: Secondary | ICD-10-CM | POA: Insufficient documentation

## 2017-05-25 DIAGNOSIS — T43025A Adverse effect of tetracyclic antidepressants, initial encounter: Secondary | ICD-10-CM | POA: Insufficient documentation

## 2017-05-25 DIAGNOSIS — T887XXA Unspecified adverse effect of drug or medicament, initial encounter: Secondary | ICD-10-CM | POA: Diagnosis not present

## 2017-05-25 DIAGNOSIS — T50905A Adverse effect of unspecified drugs, medicaments and biological substances, initial encounter: Secondary | ICD-10-CM

## 2017-05-25 DIAGNOSIS — Y658 Other specified misadventures during surgical and medical care: Secondary | ICD-10-CM | POA: Insufficient documentation

## 2017-05-25 HISTORY — DX: Schizoaffective disorder, unspecified: F25.9

## 2017-05-25 HISTORY — DX: Anxiety disorder, unspecified: F41.9

## 2017-05-25 HISTORY — DX: Bipolar disorder, unspecified: F31.9

## 2017-05-25 LAB — RAPID URINE DRUG SCREEN, HOSP PERFORMED
Amphetamines: NOT DETECTED
BARBITURATES: NOT DETECTED
BENZODIAZEPINES: NOT DETECTED
Cocaine: NOT DETECTED
Opiates: NOT DETECTED
TETRAHYDROCANNABINOL: NOT DETECTED

## 2017-05-25 LAB — CBC WITH DIFFERENTIAL/PLATELET
Basophils Absolute: 0 10*3/uL (ref 0.0–0.1)
Basophils Relative: 0 %
Eosinophils Absolute: 0.4 10*3/uL (ref 0.0–0.7)
Eosinophils Relative: 4 %
HEMATOCRIT: 47.7 % (ref 39.0–52.0)
Hemoglobin: 15.3 g/dL (ref 13.0–17.0)
LYMPHS PCT: 19 %
Lymphs Abs: 1.6 10*3/uL (ref 0.7–4.0)
MCH: 29.1 pg (ref 26.0–34.0)
MCHC: 32.1 g/dL (ref 30.0–36.0)
MCV: 90.9 fL (ref 78.0–100.0)
MONO ABS: 0.7 10*3/uL (ref 0.1–1.0)
MONOS PCT: 8 %
Neutro Abs: 5.6 10*3/uL (ref 1.7–7.7)
Neutrophils Relative %: 69 %
Platelets: 213 10*3/uL (ref 150–400)
RBC: 5.25 MIL/uL (ref 4.22–5.81)
RDW: 12.5 % (ref 11.5–15.5)
WBC: 8.3 10*3/uL (ref 4.0–10.5)

## 2017-05-25 LAB — BASIC METABOLIC PANEL
Anion gap: 10 (ref 5–15)
BUN: 17 mg/dL (ref 6–20)
CO2: 27 mmol/L (ref 22–32)
Calcium: 9.7 mg/dL (ref 8.9–10.3)
Chloride: 103 mmol/L (ref 101–111)
Creatinine, Ser: 0.72 mg/dL (ref 0.61–1.24)
GFR calc non Af Amer: >60 mL/min (ref >60)
Glucose, Bld: 87 mg/dL (ref 65–99)
Potassium: 4.3 mmol/L (ref 3.5–5.1)
SODIUM: 140 mmol/L (ref 135–145)

## 2017-05-25 LAB — RAPID STREP SCREEN (MED CTR MEBANE ONLY): Streptococcus, Group A Screen (Direct): NEGATIVE

## 2017-05-25 LAB — ETHANOL

## 2017-05-25 MED ORDER — BENZTROPINE MESYLATE 1 MG/ML IJ SOLN
1.0000 mg | Freq: Once | INTRAMUSCULAR | Status: AC
  Administered 2017-05-25: 1 mg via INTRAMUSCULAR
  Filled 2017-05-25: qty 1

## 2017-05-25 MED ORDER — IOPAMIDOL (ISOVUE-300) INJECTION 61%
75.0000 mL | Freq: Once | INTRAVENOUS | Status: AC | PRN
  Administered 2017-05-25: 75 mL via INTRAVENOUS

## 2017-05-25 MED ORDER — BENZTROPINE MESYLATE 1 MG PO TABS: 1.0000 mg | ORAL_TABLET | Freq: Every day | ORAL | 0 refills | Status: DC

## 2017-05-25 MED ORDER — DIPHENHYDRAMINE HCL 50 MG/ML IJ SOLN
50.0000 mg | Freq: Once | INTRAMUSCULAR | Status: AC
  Administered 2017-05-25: 50 mg via INTRAVENOUS
  Filled 2017-05-25: qty 1

## 2017-05-25 NOTE — ED Provider Notes (Signed)
[facility] EMERGENCY DEPARTMENT Provider Note   CSN: [id number] Arrival date & time: 05/25/17  1251     History   Chief Complaint Chief Complaint  Patient presents with  . Allergic Reaction    HPI Theressa StampsWilliam M Titterington is a 22 y.o. male.   Allergic Reaction    Pt was seen at 1345. Per pt and his mother, c/o gradual onset and worsening of persistent "muffled voice" and "slurred speech" since he was [facility] week ago. Pt states he has had difficulty swallowing fluids, but can swallow solid foods without difficulty. Pt was started on multiple new medications while admitted to psych facility, no changes when discharged. Denies sore throat, no fevers, no rash, no SOB, no wheezing/stridor, no CP/palpitations, no abd pain, no N/V/D, no injury.   Past Medical History:  Diagnosis Date  . Anxiety   . Bipolar 1 disorder (HCC)   . Schizoaffective disorder [facility](HCC)     Patient Active Problem List   Diagnosis Date Noted  . Schizoaffective disorder, depressive type (HCC) 05/11/2017  . Psychosis (HCC) 05/11/2017  . Vitamin D deficiency 01/07/2016  . Gastroesophageal reflux disease with esophagitis 08/31/2015  . Anxiety 08/31/2015  . Irritable bowel syndrome 08/30/2012  . Abdominal pain, chronic, epigastric 08/02/2012  . Left ankle sprain 08/02/2012    Past Surgical History:  Procedure Laterality Date  . TYMPANOSTOMY TUBE PLACEMENT         Home Medications    Prior to Admission medications   Medication Sig Start Date End Date Taking? Authorizing Provider  gabapentin (NEURONTIN) 100 MG capsule Take 1 capsule (100 mg total) by mouth 3 (three) times daily. 05/19/17  Yes Oneta RackLewis, Tanika N, NP  hydrOXYzine (ATARAX/VISTARIL) 25 MG tablet Take 1 tablet (25 mg total) by mouth every 4 (four) hours as needed for anxiety (Sleep). 05/19/17  Yes Oneta RackLewis, Tanika N, NP  mirtazapine (REMERON SOL-TAB) 15 MG disintegrating tablet Take 1 tablet (15 mg total) by mouth at bedtime as needed and  may repeat dose one time if needed (insomnia). 05/19/17  Yes Oneta RackLewis, Tanika N, NP  paliperidone (INVEGA) 6 MG 24 hr tablet Take 1 tablet (6 mg total) by mouth daily. 05/20/17  Yes Oneta RackLewis, Tanika N, NP    Family History Family History  Problem Relation Age of Onset  . Mental illness Mother   . Heart attack Other     Social History Social History   Tobacco Use  . Smoking status: Never Smoker  . Smokeless tobacco: Never Used  Substance Use Topics  . Alcohol use: No  . Drug use: No     Allergies   Patient has no known allergies.   Review of Systems Review of Systems ROS: Statement: All systems negative except as marked or noted in the HPI; Constitutional: Negative for fever and chills. ; ; Eyes: Negative for eye pain, redness and discharge. ; ; ENMT: Negative for ear pain, hoarseness, nasal congestion, sinus pressure and sore throat. ; ; Cardiovascular: Negative for chest pain, palpitations, diaphoresis, dyspnea and peripheral edema. ; ; Respiratory: Negative for cough, wheezing and stridor. ; ; Gastrointestinal: Negative for nausea, vomiting, diarrhea, abdominal pain, blood in stool, hematemesis, jaundice and rectal bleeding. . ; ; Genitourinary: Negative for dysuria, flank pain and hematuria. ; ; Musculoskeletal: Negative for back pain and neck pain. Negative for swelling and trauma.; ; Skin: Negative for pruritus, rash, abrasions, blisters, bruising and skin lesion.; ; Neuro: +slurred speech, muffled voice. Negative for headache, lightheadedness and neck stiffness.  Negative for weakness, altered level of consciousness, altered mental status, extremity weakness, paresthesias, involuntary movement, seizure and syncope.      Physical Exam Updated Vital Signs BP 119/82   Pulse (!) 105   Temp 97.9 F (36.6 C) (Oral)   Resp 18   Wt 68 kg (150 lb)   SpO2 97%   BMI 22.15 kg/m   Physical Exam 1350: Physical examination:  Nursing notes reviewed; Vital signs and O2 SAT reviewed;   Constitutional: Well developed, Well nourished, Well hydrated, In no acute distress; Head:  Normocephalic, atraumatic; Eyes: EOMI, PERRL, No scleral icterus; ENMT: TM's clear bilat. +edemetous nasal turbinates bilat with clear rhinorrhea. Mouth and pharynx without lesions. No tonsillar exudates. No intra-oral edema. No submandibular or sublingual edema. +muffled voice. Airway patent. Swallowing secretions without difficulty during exam. No drooling, no stridor. No pain with manipulation of larynx. No trismus.  Mouth and pharynx normal, Mucous membranes moist; Neck: Supple, Full range of motion, No lymphadenopathy; Cardiovascular: Regular rate and rhythm, No gallop; Respiratory: Breath sounds clear & equal bilaterally, No wheezes.  Speaking full sentences with ease, Normal respiratory effort/excursion; Chest: Nontender, Movement normal; Abdomen: Soft, Nontender, Nondistended, Normal bowel sounds; Genitourinary: No CVA tenderness; Extremities: Pulses normal, No tenderness, No edema, No calf edema or asymmetry.; Neuro: AA&Ox3, Major CN grossly intact. No facial droop. Speech clear. No gross focal motor or sensory deficits in extremities. Climbs on and off stretcher easily by himself. Gait steady.; Skin: Color normal, Warm, Dry.   ED Treatments / Results  Labs (all labs ordered are listed, but only abnormal results are displayed)   EKG  EKG Interpretation None       Radiology   Procedures Procedures (including critical care time)  Medications Ordered in ED Medications  diphenhydrAMINE (BENADRYL) injection 50 mg (50 mg Intravenous Given 05/25/17 1400)     Initial Impression / Assessment and Plan / ED Course  I have reviewed the triage vital signs and the nursing notes.  Pertinent labs & imaging results that were available during my care of the patient were reviewed by me and considered in my medical decision making (see chart for details).  MDM Reviewed: previous chart, nursing note and  vitals Reviewed previous: labs Interpretation: labs and CT scan    Results for orders placed or performed during the hospital encounter of 05/25/17  Rapid strep screen  Result Value Ref Range   Streptococcus, Group A Screen (Direct) NEGATIVE NEGATIVE  Basic metabolic panel  Result Value Ref Range   Sodium 140 135 - 145 mmol/L   Potassium 4.3 3.5 - 5.1 mmol/L   Chloride 103 101 - 111 mmol/L   CO2 27 22 - 32 mmol/L   Glucose, Bld 87 65 - 99 mg/dL   BUN 17 6 - 20 mg/dL   Creatinine, Ser 0.96 0.61 - 1.24 mg/dL   Calcium 9.7 8.9 - 04.5 mg/dL   GFR calc non Af Amer >60 >60 mL/min   GFR calc Af Amer >60 >60 mL/min   Anion gap 10 5 - 15  CBC with Differential  Result Value Ref Range   WBC 8.3 4.0 - 10.5 K/uL   RBC 5.25 4.22 - 5.81 MIL/uL   Hemoglobin 15.3 13.0 - 17.0 g/dL   HCT 40.9 81.1 - 91.4 %   MCV 90.9 78.0 - 100.0 fL   MCH 29.1 26.0 - 34.0 pg   MCHC 32.1 30.0 - 36.0 g/dL   RDW 78.2 95.6 - 21.3 %   Platelets 213 150 -  400 K/uL   Neutrophils Relative % 69 %   Neutro Abs 5.6 1.7 - 7.7 K/uL   Lymphocytes Relative 19 %   Lymphs Abs 1.6 0.7 - 4.0 K/uL   Monocytes Relative 8 %   Monocytes Absolute 0.7 0.1 - 1.0 K/uL   Eosinophils Relative 4 %   Eosinophils Absolute 0.4 0.0 - 0.7 K/uL   Basophils Relative 0 %   Basophils Absolute 0.0 0.0 - 0.1 K/uL  Urine rapid drug screen (hosp performed)  Result Value Ref Range   Opiates NONE DETECTED NONE DETECTED   Cocaine NONE DETECTED NONE DETECTED   Benzodiazepines NONE DETECTED NONE DETECTED   Amphetamines NONE DETECTED NONE DETECTED   Tetrahydrocannabinol NONE DETECTED NONE DETECTED   Barbiturates NONE DETECTED NONE DETECTED  Ethanol  Result Value Ref Range   Alcohol, Ethyl (B) <10 <10 mg/dL   Ct Head Wo Contrast Result Date: 05/25/2017 CLINICAL DATA:  Dysphagia and dysarthria EXAM: CT HEAD WITHOUT CONTRAST TECHNIQUE: Contiguous axial images were obtained from the base of the skull through the vertex without intravenous  contrast. COMPARISON:  Brain MRI November 16, 2009 FINDINGS: Brain: The ventricles are normal in size and configuration. There is no intracranial mass, hemorrhage, extra-axial fluid collection, or midline shift. Gray-white compartments are normal. No evident acute infarct. Vascular: No hyperdense vessel.  No vascular calcification evident. Skull: The bony calvarium appears intact. Sinuses/Orbits: There is mucosal thickening in several ethmoid air cells. Other visualized paranasal sinuses are clear. Orbits appear symmetric bilaterally. Other: Visualized mastoid air cells are clear. IMPRESSION: Mucosal thickening in several ethmoid air cells. Study otherwise unremarkable. Electronically Signed   By: Bretta Bang III M.D.   On: 05/25/2017 14:33   Ct Soft Tissue Neck W Contrast Result Date: 05/25/2017 CLINICAL DATA:  22 year old male with difficulty swallowing for several days. Unexplained dysphagia. EXAM: CT NECK WITH CONTRAST TECHNIQUE: Multidetector CT imaging of the neck was performed using the standard protocol following the bolus administration of intravenous contrast. CONTRAST:  75mL ISOVUE-300 IOPAMIDOL (ISOVUE-300) INJECTION 61% COMPARISON:  Head CT today reported separately. Brain MRI 11/16/2009. FINDINGS: Pharynx and larynx: Normal laryngeal soft tissue contours. Normal pharyngeal soft tissue contours. Tonsillar tissues appear normal. Parapharyngeal and retropharyngeal spaces are normal. Salivary glands: Sublingual space, submandibular glands, and parotid glands are normal. Thyroid: Normal. Lymph nodes: No cervical lymphadenopathy. Bilateral lymph nodes are within normal limits. Vascular: Suboptimal intravascular contrast bolus but major vascular structures in the neck and at the skull base appear patent. Limited intracranial: Negative; see also noncontrast head CT today reported separately. Visualized orbits: Negative. Mastoids and visualized paranasal sinuses: And clear aside from trace left anterior  sphenoid ethmoid mucosal thickening. Bilateral tympanic cavities, mastoid air cells, and petrous apex air cells are clear. Skeleton: No osseous or dental abnormality identified. There is mild calcification of the bilateral stylohyoid ligament, but fairly subtle. Upper chest: Normal visualized superior mediastinum and bilateral upper lungs. The cervical and visible upper thoracic esophagus appear normal. IMPRESSION: 1. Normal CT appearance of the neck.  No explanation for dysphagia. 2. Head CT reported separately today. Electronically Signed   By: Odessa Fleming M.D.   On: 05/25/2017 14:37    1650:  Workup reassuring. T/C to [facility] NP, case discussed, including:  HPI, pertinent PM/SHx, VS/PE, dx testing, ED course and treatment:  Likely s/e of meds, dose cogentin 1mg  IM now, then rx cogentin 1mg  PO daily, pt can be d/c and f/u with his outpatient therapist.  1730:  Pt feeling better after  IM cogentin. Has tol PO well without choking/gagging/drooling or N/V. Pt and mother ready to go home now. Dx and testing d/w pt and family.  Questions answered.  Verb understanding, agreeable to d/c home with outpt f/u.       Final Clinical Impressions(s) / ED Diagnoses   Final diagnoses:  None    ED Discharge Orders    None        Samuel Jester, DO 05/27/17 1355

## 2017-05-25 NOTE — ED Triage Notes (Addendum)
Voice sounds muffled. Able to control secretions and swallow secretions

## 2017-05-25 NOTE — ED Triage Notes (Signed)
Pt reports that he has been having difficulty swallowing for several days. Reports that he has able to swallow food, but unable to swallow water. Pt states he recently was discharged from hospital. Grandmother reports swelling has gotten worse since Tuesday Pt has been prescribed multiple new meds

## 2017-05-25 NOTE — ED Notes (Signed)
Pt reports that swallowing has improved

## 2017-05-25 NOTE — Consult Note (Signed)
  Spoke with Dr. Clarene DukeMcManus regarding a patient that presented to the [facility] David [name] 22 yr old male who was [facility]17 and present complaints of difficulty swallowing and muffled voice since starting Invega 6 mg 24 hr daily, Remeron 15 mg Q hs, Gabapentin 100 mg Tid, and Vistaril 25 mg Q 4 hr prn anxiety.  Possible EPS suggested to give Cogentin 1 mg IM and to send home with prescription of Cogentin 1 mg daily enough until patient is able to see his outpatient psychiatric provider.    David B. Rankin, NP

## 2017-05-25 NOTE — Discharge Instructions (Signed)
Take the prescription as directed.  Continues to take your usual prescriptions as previously directed. Call your regular medical doctor on Monday to schedule a follow up appointment within the next 3 days. Call your mental health provider on Monday to schedule a follow up appointment within the next week.  Return to the Emergency Department immediately sooner if worsening.

## 2017-05-28 LAB — CULTURE, GROUP A STREP (THRC)

## 2017-06-15 ENCOUNTER — Ambulatory Visit (INDEPENDENT_AMBULATORY_CARE_PROVIDER_SITE_OTHER): Payer: Self-pay | Admitting: Nurse Practitioner

## 2017-06-15 ENCOUNTER — Encounter: Payer: Self-pay | Admitting: Nurse Practitioner

## 2017-06-15 VITALS — BP 118/82 | Temp 98.4°F | Ht 69.0 in

## 2017-06-15 DIAGNOSIS — R1314 Dysphagia, pharyngoesophageal phase: Secondary | ICD-10-CM

## 2017-06-15 DIAGNOSIS — K2100 Gastro-esophageal reflux disease with esophagitis, without bleeding: Secondary | ICD-10-CM

## 2017-06-15 DIAGNOSIS — K117 Disturbances of salivary secretion: Secondary | ICD-10-CM

## 2017-06-15 DIAGNOSIS — R682 Dry mouth, unspecified: Secondary | ICD-10-CM

## 2017-06-15 DIAGNOSIS — K580 Irritable bowel syndrome with diarrhea: Secondary | ICD-10-CM

## 2017-06-15 MED ORDER — RANITIDINE HCL 300 MG PO TABS: 300.0000 mg | ORAL_TABLET | Freq: Every day | ORAL | 5 refills | Status: DC

## 2017-06-15 NOTE — Patient Instructions (Addendum)
  Carmex for dry lips  Food Choices for Gastroesophageal Reflux Disease, Adult When you have gastroesophageal reflux disease (GERD), the foods you eat and your eating habits are very important. Choosing the right foods can help ease your discomfort. What guidelines do I need to follow?  Choose fruits, vegetables, whole grains, and low-fat dairy products.  Choose low-fat meat, fish, and poultry.  Limit fats such as oils, salad dressings, butter, nuts, and avocado.  Keep a food diary. This helps you identify foods that cause symptoms.  Avoid foods that cause symptoms. These may be different for everyone.  Eat small meals often instead of 3 large meals a day.  Eat your meals slowly, in a place where you are relaxed.  Limit fried foods.  Cook foods using methods other than frying.  Avoid drinking alcohol.  Avoid drinking large amounts of liquids with your meals.  Avoid bending over or lying down until 2-3 hours after eating. What foods are not recommended? These are some foods and drinks that may make your symptoms worse: Vegetables Tomatoes. Tomato juice. Tomato and spaghetti sauce. Chili peppers. Onion and garlic. Horseradish. Fruits Oranges, grapefruit, and lemon (fruit and juice). Meats High-fat meats, fish, and poultry. This includes hot dogs, ribs, ham, sausage, salami, and bacon. Dairy Whole milk and chocolate milk. Sour cream. Cream. Butter. Ice cream. Cream cheese. Drinks Coffee and tea. Bubbly (carbonated) drinks or energy drinks. Condiments Hot sauce. Barbecue sauce. Sweets/Desserts Chocolate and cocoa. Donuts. Peppermint and spearmint. Fats and Oils High-fat foods. This includes [place] fries and potato chips. Other Vinegar. Strong spices. This includes black pepper, white pepper, red pepper, cayenne, curry powder, cloves, ginger, and chili powder. The items listed above may not be a complete list of foods and drinks to avoid. Contact your dietitian for more  information. This information is not intended to replace advice given to you by your health care provider. Make sure you discuss any questions you have with your health care provider. Document Released: 10/24/2011 Document Revised: 09/30/2015 Document Reviewed: 02/26/2013 Elsevier Interactive Patient Education  2017 [employer]

## 2017-06-16 ENCOUNTER — Encounter: Payer: Self-pay | Admitting: Nurse Practitioner

## 2017-06-16 DIAGNOSIS — R1314 Dysphagia, pharyngoesophageal phase: Secondary | ICD-10-CM | POA: Insufficient documentation

## 2017-06-16 DIAGNOSIS — R682 Dry mouth, unspecified: Secondary | ICD-10-CM

## 2017-06-16 DIAGNOSIS — K117 Disturbances of salivary secretion: Secondary | ICD-10-CM | POA: Insufficient documentation

## 2017-06-16 NOTE — Progress Notes (Signed)
Subjective:  Presents for c/o difficulty swallowing for the past 2 weeks. Was admitted to behavioral health on 1/4-1/12. Started on new medications which is helping schizoaffective disorder but having severe dry mouth and throat at times. Food gets stuck near the base of his throat especially if he does not drink enough fluids while eating. Worse with foods such as bread or chicken breast. Has had significant choking twice which caused coughing spells for about 30 minutes. No occluded airway. Has started eating smaller bites of food, chewing well and drinking fluids which has helped. Having reflux symptoms 2-3/7 days of the week. Non smoker. Denies alcohol use. Drinks moderate amount caffeine. Occasional upper abdominal pain. Has a history of IBS diarrhea; has had some episodes.   Objective:   BP 118/82   Temp 98.4 F (36.9 C) (Oral)   Ht 5\' 9"  (1.753 m)   BMI 22.15 kg/m  NAD. Alert, oriented. TMs minimal clear effusion. Pharynx non erythematous. Mouth slightly dry. Neck supple with minimal adenopathy. Thyroid: no mass or goiter noted. Mildly tender to palpation. Lungs clear. Heart RRR. Abdomen soft, non distended, mild epigastric area tenderness.   Assessment:   Problem List Items Addressed This Visit      Digestive   Gastroesophageal reflux disease with esophagitis   Relevant Orders   TSH   Irritable bowel syndrome   Relevant Medications   ranitidine (ZANTAC) 300 MG tablet   Other Relevant Orders   TSH   Pharyngoesophageal dysphagia - Primary   Relevant Orders   TSH   Xerostomia       Plan:   Meds ordered this encounter  Medications  . ranitidine (ZANTAC) 300 MG tablet    Sig: Take 1 tablet (300 mg total) by mouth at bedtime.    Dispense:  30 tablet    Refill:  5    Order Specific Question:   Supervising Provider    Answer:   Merlyn AlbertLUKING, Zackeriah S [[id number]]   Discussed lifestyle factors affecting reflux. Start Zantac as directed. Call back in 3-4 weeks if persists.  Continue  current measures for dry mouth and food intake. Do not discontinue current meds without psychiatric consultation. Has appointment scheduled for follow up with [facility]. TSH pending. No other labs; limited insurance coverage.  Return if symptoms worsen or fail to improve.

## 2017-06-18 ENCOUNTER — Telehealth: Payer: Self-pay | Admitting: Family Medicine

## 2017-06-18 MED ORDER — RANITIDINE HCL 300 MG PO TABS: 300.0000 mg | ORAL_TABLET | Freq: Every day | ORAL | 5 refills | Status: DC

## 2017-06-18 NOTE — Telephone Encounter (Signed)
Left message to return call- [facility] [place]

## 2017-06-18 NOTE — Telephone Encounter (Signed)
Pt's Medicaid will not cover Ranitidine (Family Planning coverage only)  ranitidine (ZANTAC) 300 MG tablet     Sig: Take 1 tablet (300 mg total) by mouth at bedtime  (this was ordered 06/15/17 at [facility])    Please advise

## 2017-06-18 NOTE — Telephone Encounter (Signed)
[facility] or $10 for 3 months. One of the least expensive meds prescribed for reflux

## 2017-06-20 LAB — TSH: TSH: 1.65 u[IU]/mL (ref 0.450–4.500)

## 2017-06-20 NOTE — Telephone Encounter (Signed)
Also has a result note : I called and left a message today 06/20/2017

## 2017-06-22 NOTE — Telephone Encounter (Signed)
Patient notified and verbalized understanding. 

## 2017-08-24 ENCOUNTER — Ambulatory Visit: Payer: Self-pay | Admitting: Family Medicine

## 2017-08-24 ENCOUNTER — Encounter: Payer: Self-pay | Admitting: Family Medicine

## 2017-08-24 VITALS — BP 132/82 | Temp 97.5°F | Ht 69.0 in | Wt 198.6 lb

## 2017-08-24 DIAGNOSIS — J301 Allergic rhinitis due to pollen: Secondary | ICD-10-CM

## 2017-08-24 DIAGNOSIS — J019 Acute sinusitis, unspecified: Secondary | ICD-10-CM

## 2017-08-24 MED ORDER — AMOXICILLIN 500 MG PO TABS: 500.0000 mg | ORAL_TABLET | Freq: Three times a day (TID) | ORAL | 0 refills | Status: DC

## 2017-08-24 MED ORDER — METHYLPREDNISOLONE ACETATE 40 MG/ML IJ SUSP
40.0000 mg | Freq: Once | INTRAMUSCULAR | Status: AC
  Administered 2017-08-24: 40 mg via INTRAMUSCULAR

## 2017-08-24 NOTE — Progress Notes (Addendum)
   Subjective:    Patient ID: David [name], male    DOB: 1995/08/23, 22 y.o.   MRN sinusitis antibiotics prescribed warning signs discussed follow-up if problems sinusitis: [id number]  Sinusitis  This is a new problem. The current episode started yesterday. Associated symptoms include congestion, coughing, headaches, sneezing and a sore throat. Pertinent negatives include no chills or ear pain. (Runny nose) Treatments tried: mucinex sinus max, claritin, oxymetazoline. The treatment provided mild relief.    Significant head congestion drainage coughing sinus pressure denies wheezing difficulty breathing  Review of Systems  Constitutional: Negative for activity change, chills and fever.  HENT: Positive for congestion, rhinorrhea, sneezing and sore throat. Negative for ear pain.   Eyes: Negative for discharge.  Respiratory: Positive for cough. Negative for wheezing.   Cardiovascular: Negative for chest pain.  Gastrointestinal: Negative for nausea and vomiting.  Musculoskeletal: Negative for arthralgias.  Neurological: Positive for headaches.       Objective:   Physical Exam  Constitutional: He appears well-developed.  HENT:  Head: Normocephalic and atraumatic.  Mouth/Throat: Oropharynx is clear and moist. No oropharyngeal exudate.  Eyes: Right eye exhibits no discharge. Left eye exhibits no discharge.  Neck: Normal range of motion.  Cardiovascular: Normal rate, regular rhythm and normal heart sounds.  No murmur heard. Pulmonary/Chest: Effort normal and breath sounds normal. No respiratory distress. He has no wheezes. He has no rales.  Lymphadenopathy:    He has no cervical adenopathy.  Neurological: He exhibits normal muscle tone.  Skin: Skin is warm and dry.  Nursing note and vitals reviewed.  Depo-Medrol shot given because of allergies   Sinusitis antibiotics prescribed warning signs discussed follow-up if problems    Assessment & Plan:

## 2017-10-19 ENCOUNTER — Encounter: Payer: Self-pay | Admitting: Family Medicine

## 2017-10-19 ENCOUNTER — Ambulatory Visit (INDEPENDENT_AMBULATORY_CARE_PROVIDER_SITE_OTHER): Payer: Self-pay | Admitting: Family Medicine

## 2017-10-19 VITALS — BP 126/70 | Ht 69.0 in | Wt 200.1 lb

## 2017-10-19 DIAGNOSIS — J392 Other diseases of pharynx: Secondary | ICD-10-CM

## 2017-10-19 NOTE — Progress Notes (Signed)
   Subjective:    Patient ID: David [name], male    DOB: March 12, 1996, 22 y.o.   MRN: [id number]  HPI Patient states he gags every time he brushes his teeth and sometimes throws up. He is under the care of psychiatry He denies any regurgitation denies vomiting denies true dysphagia states he has been gagging when he brushes his teeth he also relates recently he feels like he cannot be alone he denies being depressed or suicidal he just states he finds himself feeling anxious and wanting to talk a lot he states he was off of his medicines for a while but his grandmother had him start taking his medicine again 2 days ago Review of Systems Does relate going to psychiatry on a regular basis and counseling currently he denies any chest tightness pressure pain shortness breath fever chills sweats    Objective:   Physical Exam Throat nonerythematous no masses are seen no the lungs are clear no crackles heart is regular no murmurs abdomen is soft       Assessment & Plan:  Frequent gagging sensation I find no evidence of any type of obstruction I do not recommend any type of EGD We talked about some behavioral ways of controlling this If it does not resolve may need to get in with gastroenterology for further evaluation but that is not necessary currently I have encouraged patient to talk with his psychiatrist about medications that might help his anxiety issues Continue his current medicines keep regular follow-up visits and counseling visits

## 2017-11-28 ENCOUNTER — Ambulatory Visit (INDEPENDENT_AMBULATORY_CARE_PROVIDER_SITE_OTHER): Payer: Self-pay | Admitting: Family Medicine

## 2017-11-28 ENCOUNTER — Encounter: Payer: Self-pay | Admitting: Family Medicine

## 2017-11-28 VITALS — BP 112/82 | Temp 98.6°F | Ht 69.0 in | Wt 205.0 lb

## 2017-11-28 DIAGNOSIS — J392 Other diseases of pharynx: Secondary | ICD-10-CM

## 2017-11-28 DIAGNOSIS — K130 Diseases of lips: Secondary | ICD-10-CM

## 2017-11-28 MED ORDER — KETOCONAZOLE 2 % EX CREA: TOPICAL_CREAM | CUTANEOUS | 4 refills | Status: DC

## 2017-11-28 NOTE — Progress Notes (Signed)
   Subjective:    Patient ID: David [name], male    DOB: [birth year], 22 y.o.   MRN: [id number]  HPI This patient has had increased problem with gag reflex Finds himself when he presses teeth when he tries to swallow big pills he gags He denies high fever chills sweats Denies neck pain denies nausea vomiting diarrhea Patient is here today with complaints of gagg reflex problems. States he is having problems with brushing teeth and tongue with the gagging. Also has problems swallowing pills.   Review of Systems  Constitutional: Negative for diaphoresis and fatigue.  HENT: Negative for congestion and rhinorrhea.   Respiratory: Negative for cough and shortness of breath.   Cardiovascular: Negative for chest pain and leg swelling.  Gastrointestinal: Negative for abdominal pain and diarrhea.  Skin: Negative for color change and rash.  Neurological: Negative for dizziness and headaches.  Psychiatric/Behavioral: Negative for behavioral problems and confusion.       Objective:   Physical Exam  Constitutional: He appears well-nourished. No distress.  HENT:  Head: Normocephalic and atraumatic.  Eyes: Right eye exhibits no discharge. Left eye exhibits no discharge.  Neck: No tracheal deviation present.  Cardiovascular: Normal rate, regular rhythm and normal heart sounds.  No murmur heard. Pulmonary/Chest: Effort normal and breath sounds normal. No respiratory distress.  Musculoskeletal: He exhibits no edema.  Lymphadenopathy:    He has no cervical adenopathy.  Neurological: He is alert. Coordination normal.  Skin: Skin is warm and dry.  Psychiatric: He has a normal mood and affect. His behavior is normal.  Vitals reviewed.   His foot exam is perfectly normal and his neck exam perfectly normal  Patient also has angular cheilitisuse antifungal creams and avoid licking the corner of his lips    Assessment & Plan:  He has a poor gag reflex Length about the importance of  minimizing with his eating and taking big pills instead use small pills and small bites Also talked about mind of her body control and anxiety reduction 15 minutes was spent with patient today discussing healthcare issues which they came.  More than 50% of this visit-total duration of visit-was spent in counseling and coordination of care.  Please see diagnosis regarding the focus of this coordination and care

## 2018-03-02 IMAGING — CT CT NECK W/ CM
4 of 8 series · 14 of 33 positions shown, 15 images · IV contrast (iopamidol)
Comparison: Head CT today reported separately. Brain MRI
11/16/2009.

CLINICAL DATA: 22-year-old male with difficulty swallowing for
several days. Unexplained dysphagia.

EXAM:
CT NECK WITH CONTRAST
TECHNIQUE: Multidetector CT imaging of the neck was performed using the
standard protocol following the bolus administration of intravenous
contrast.
CONTRAST:  75mL [id number] IOPAMIDOL (VJ6WLD-QYY) INJECTION 61%

[Series 6: axial neck · axial · 0.48mm/px · z∈[-150,-10]mm · 3 of 141 slices shown, 4 images]
[im 36/141  soft-tissue]
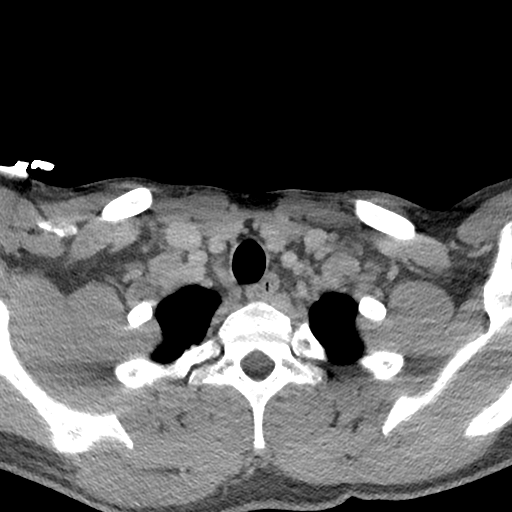
[im 36/141  bone]
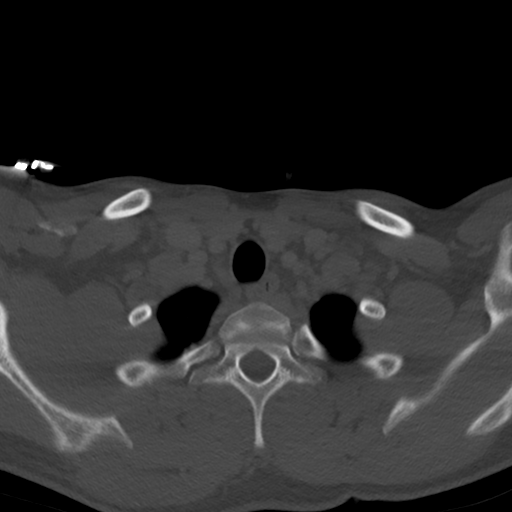
[im 71/141  bone]
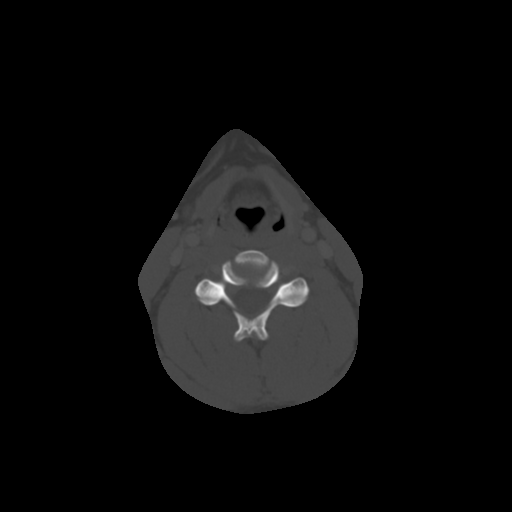
[im 106/141  bone]
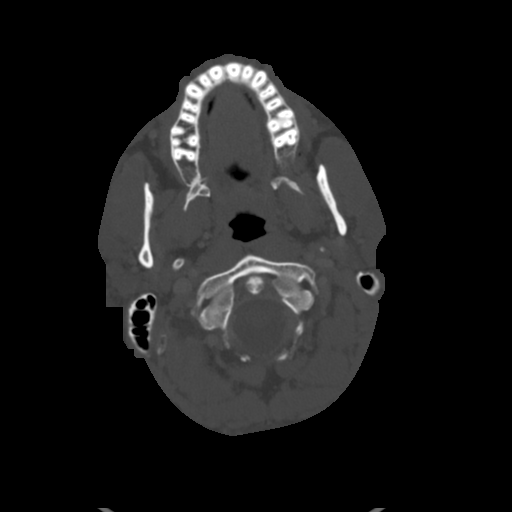

[Series 10: coronal neck · coronal · 0.38mm/px · 3 of 123 slices shown]
[im 31/123  bone]
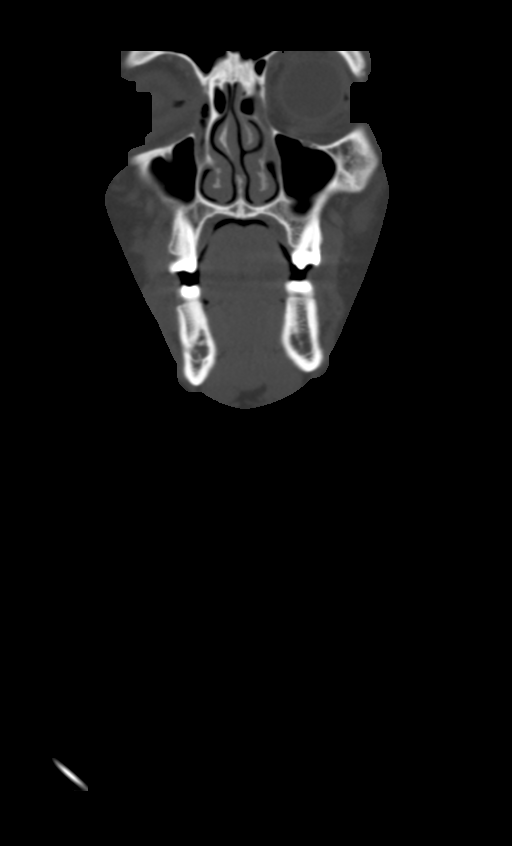
[im 62/123  bone]
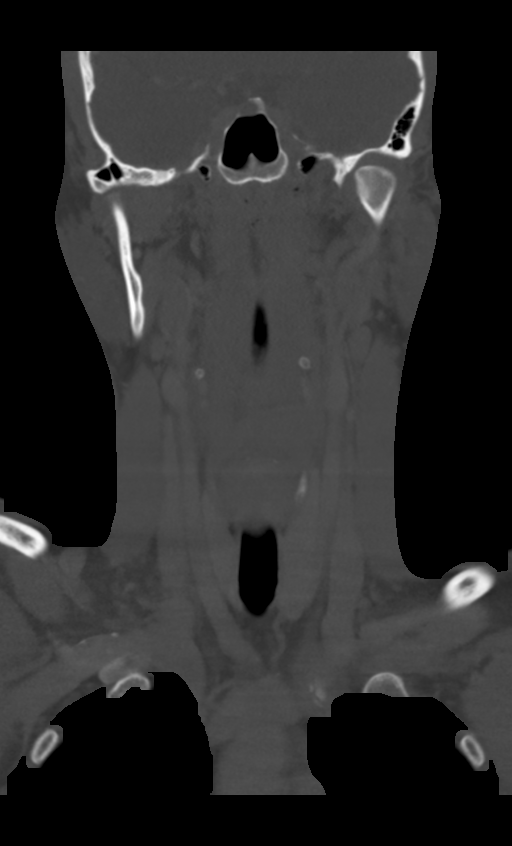
[im 92/123  bone]
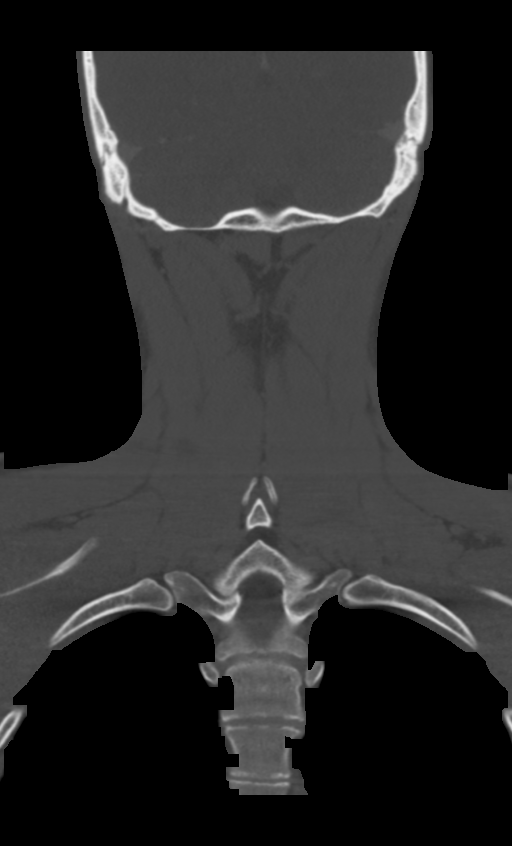

[Series 11: sagittal neck · sagittal · 0.48mm/px · 5 of 101 slices shown]
[im 26/101  bone]
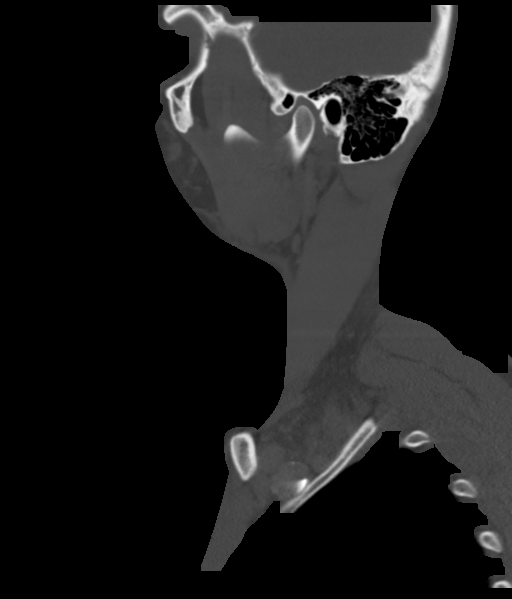
[im 38/101  bone]
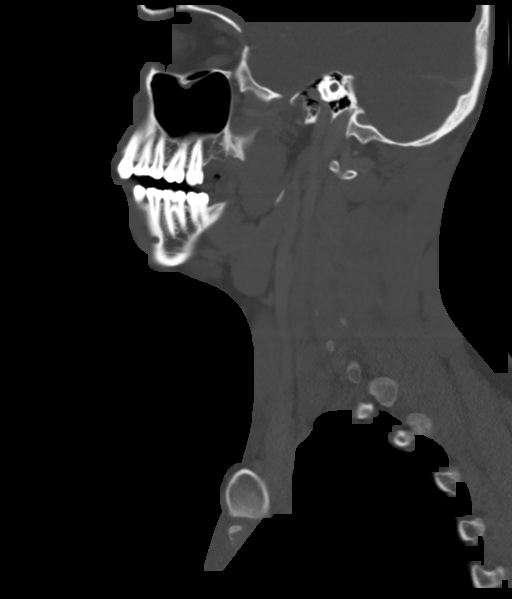
[im 51/101  bone]
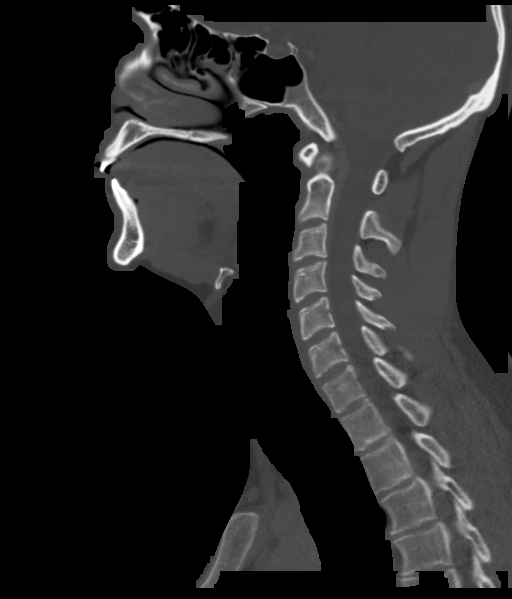
[im 63/101  bone]
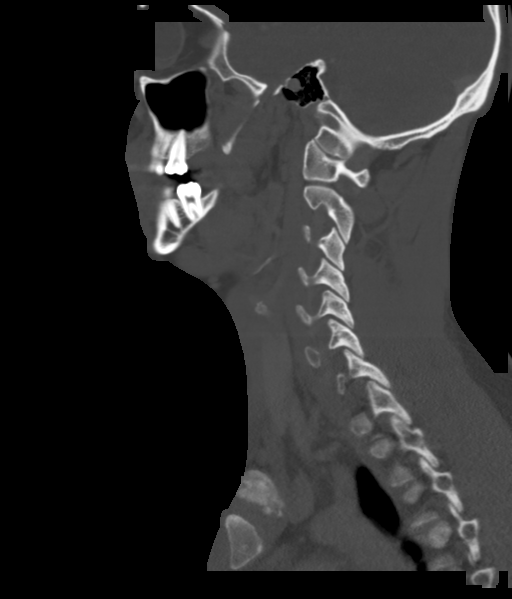
[im 76/101  bone]
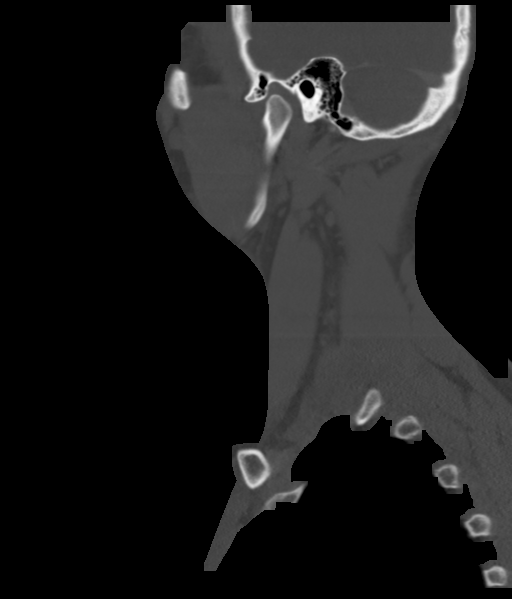

[Series 12: orthogonal ax · axial · 0.39mm/px · z∈[-151,-11]mm · 3 of 142 slices shown]
[im 36/142  bone]
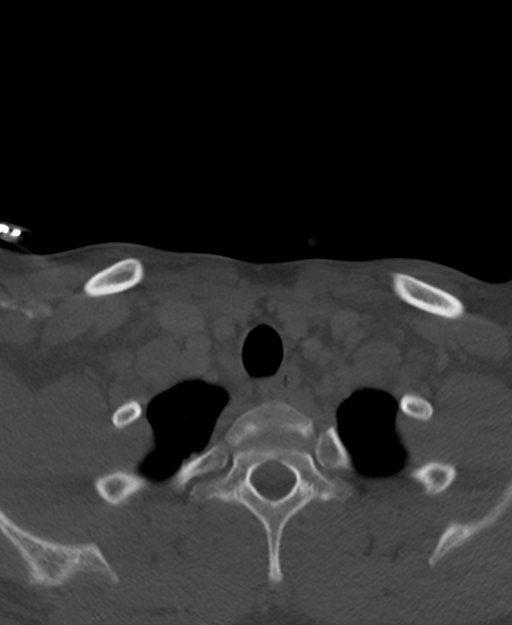
[im 71/142  bone]
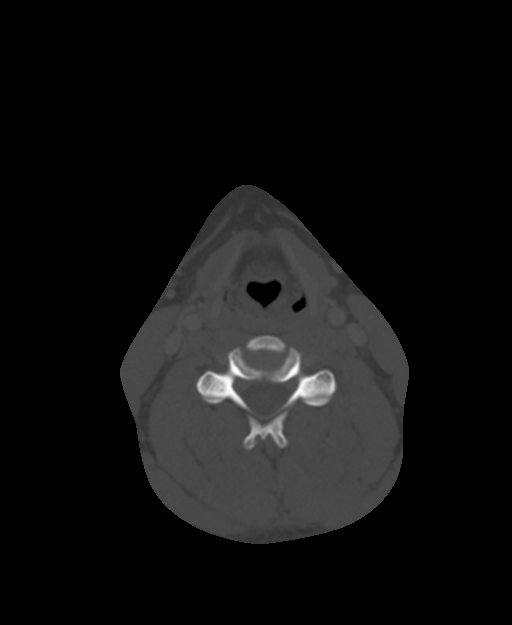
[im 106/142  bone]
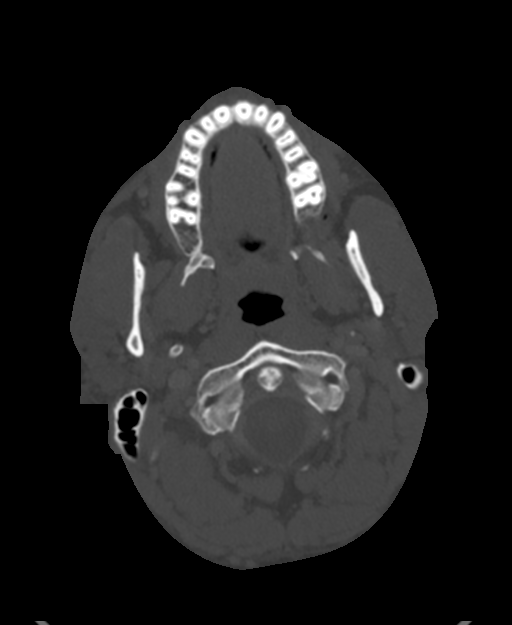

[14 of 33 positions shown; findings below may reference images not displayed]

FINDINGS: Pharynx and larynx: Normal laryngeal soft tissue contours. Normal
pharyngeal soft tissue contours. Tonsillar tissues appear normal.
Parapharyngeal and retropharyngeal spaces are normal.

Salivary glands: Sublingual space, submandibular glands, and parotid
glands are normal.

Thyroid: Normal.

Lymph nodes: No cervical lymphadenopathy. Bilateral lymph nodes are
within normal limits.

Vascular: Suboptimal intravascular contrast bolus but major vascular
structures in the neck and at the skull base appear patent.

Limited intracranial: Negative; see also noncontrast head CT today
reported separately.

Visualized orbits: Negative.

Mastoids and visualized paranasal sinuses: And clear aside from
trace left anterior sphenoid ethmoid mucosal thickening. Bilateral
tympanic cavities, mastoid air cells, and petrous apex air cells are
clear.

Skeleton: No osseous or dental abnormality identified. There is mild
calcification of the bilateral stylohyoid ligament, but fairly
subtle.

Upper chest: Normal visualized superior mediastinum and bilateral
upper lungs. The cervical and visible upper thoracic esophagus
appear normal.
IMPRESSION: 1. Normal CT appearance of the neck.  No explanation for dysphagia.
2. Head CT reported separately today.

## 2018-04-15 ENCOUNTER — Ambulatory Visit (INDEPENDENT_AMBULATORY_CARE_PROVIDER_SITE_OTHER): Payer: Self-pay | Admitting: Family Medicine

## 2018-04-15 ENCOUNTER — Ambulatory Visit: Payer: Medicaid Other | Admitting: Family Medicine

## 2018-04-15 ENCOUNTER — Encounter: Payer: Self-pay | Admitting: Family Medicine

## 2018-04-15 VITALS — BP 118/82 | Temp 98.9°F | Ht 69.0 in | Wt 217.0 lb

## 2018-04-15 DIAGNOSIS — B349 Viral infection, unspecified: Secondary | ICD-10-CM

## 2018-04-15 DIAGNOSIS — S161XXA Strain of muscle, fascia and tendon at neck level, initial encounter: Secondary | ICD-10-CM

## 2018-04-15 NOTE — Progress Notes (Signed)
   Subjective:    Patient ID: David [name], male    DOB: 1996-02-11, 22 y.o.   MRN: [id number]  HPI  Patient is here today with complaints of a headache with dizziness and vomiting last night, he also reports he threw up Friday and Saturday night also. He say he took a mucinex and the headache went away. He also states he has had a cough, runny nose, sore throat two weeks ago.He does not have much of an appetite today. He does take zantac 300 mg (did not know if need to d/c with the recall). Patient relates some intermittent headaches some intermittent nausea vomiting over the weekend some runny nose cough Review of Systems  Constitutional: Negative for activity change, fatigue and fever.  HENT: Negative for congestion and rhinorrhea.   Respiratory: Negative for cough and shortness of breath.   Cardiovascular: Negative for chest pain and leg swelling.  Gastrointestinal: Positive for nausea. Negative for abdominal pain and diarrhea.  Genitourinary: Negative for dysuria and hematuria.  Neurological: Positive for headaches. Negative for weakness.  Psychiatric/Behavioral: Negative for agitation and behavioral problems.       Objective:   Physical Exam  Constitutional: He appears well-developed.  HENT:  Head: Normocephalic.  Mouth/Throat: Oropharynx is clear and moist. No oropharyngeal exudate.  Neck: Normal range of motion.  Cardiovascular: Normal rate, regular rhythm and normal heart sounds.  No murmur heard. Pulmonary/Chest: Effort normal and breath sounds normal. He has no wheezes.  Lymphadenopathy:    He has no cervical adenopathy.  Neurological: He exhibits normal muscle tone.  Skin: Skin is warm and dry.  Nursing note and vitals reviewed.  Abdomen is soft no guarding rebound or tenderness  Right side of neck some minimal tenderness increased discomfort with certain rotations and flexion does not radiate into the shoulder       Assessment & Plan:  Right side of neck  with a muscle strain-gentle range of motion exercises May use Aleve OTC do these over the next couple weeks should gradually get better if ongoing troubles notify us  Viral syndrome No need for antibiotics currently If progressive troubles or worse to follow-up

## 2018-04-18 ENCOUNTER — Ambulatory Visit (INDEPENDENT_AMBULATORY_CARE_PROVIDER_SITE_OTHER): Payer: Self-pay | Admitting: Otolaryngology

## 2018-04-18 DIAGNOSIS — H6123 Impacted cerumen, bilateral: Secondary | ICD-10-CM

## 2018-10-02 ENCOUNTER — Other Ambulatory Visit: Payer: Self-pay

## 2018-10-02 ENCOUNTER — Ambulatory Visit (INDEPENDENT_AMBULATORY_CARE_PROVIDER_SITE_OTHER): Payer: Self-pay | Admitting: Family Medicine

## 2018-10-02 DIAGNOSIS — R0981 Nasal congestion: Secondary | ICD-10-CM

## 2018-10-02 DIAGNOSIS — J310 Chronic rhinitis: Secondary | ICD-10-CM

## 2018-10-02 NOTE — Progress Notes (Signed)
   Subjective:    Patient ID: David [name], male    DOB: 11-Mar-1996, 23 y.o.   MRN: [id number]  HPI Pt states that he is having some sinus congestion. Pt states this had been ongoing for about 3-4 weeks. No fever, no cough. Pt has been taking Mucinex and Claritin. Pt states that Mucinex helps a little bit.  He relates a lot of congestion sinus pressure stuffiness uses Afrin on a regular basis as well as a decongestant nasal spray.  Denies any other particular troubles.  He is outdoors a fair amount. Virtual Visit via Video Note  I connected with David [name] on 10/02/18 at 11:00 AM EDT by a video enabled telemedicine application and verified that I am speaking with the correct person using two identifiers.  Location: Patient: Home Provider: Office   I discussed the limitations of evaluation and management by telemedicine and the availability of in person appointments. The patient expressed understanding and agreed to proceed.  History of Present Illness:    Observations/Objective:   Assessment and Plan:   Follow Up Instructions:    I discussed the assessment and treatment plan with the patient. The patient was provided an opportunity to ask questions and all were answered. The patient agreed with the plan and demonstrated an understanding of the instructions.   The patient was advised to call back or seek an in-person evaluation if the symptoms worsen or if the condition fails to improve as anticipated.  I provided 1`0 minutes of non-face-to-face time during this encounter.   Marlowe Shores, LPN    Review of Systems  Constitutional: Negative for activity change, chills and fever.  HENT: Positive for congestion and rhinorrhea. Negative for ear pain.   Eyes: Negative for discharge.  Respiratory: Negative for cough and wheezing.   Cardiovascular: Negative for chest pain.  Gastrointestinal: Negative for nausea and vomiting.  Musculoskeletal: Negative for  arthralgias.       Objective:   Physical Exam  Patient had virtual visit Appears to be in no distress Atraumatic Neuro able to relate and oriented No apparent resp distress Color normal   We did discuss healthy diet as well as exercise as well as coronavirus protection    Assessment & Plan:  Nasal congestion I believe that this is related into overusing decongestant nasal sprays I would recommend Flonase on a regular basis Claritin avoid decongestant nasal sprays may use decongestant tablets follow-up if progressive troubles no antibiotics necessary at this time

## 2018-10-02 NOTE — Patient Instructions (Signed)
Will,  It was good to speak with you today. I believe what you are dealing with is nasal congestion related to allergies as well as related to use of nasal decongestants.  I would avoid nasal decongestant such as Afrin or store brands.  It is okay to use nasal saline spray on a regular basis. I would also use a store brand of Claritin daily such as loratadine 10 mg 1 a day. Also it would be fine to use over-the-counter decongestant tablets as necessary.  Avoid taking those near bedtime because they can make it difficult to sleep. It would be fine to use store brand of Flonase 2 sprays each nostril every single day over the course of several days that should help lessen the congestion. You may need to use the Claritin and Flonase nasal spray on a regular basis over the next couple months.  Should you have ongoing trouble please let us know. Take care-Dr. Lorin Picket

## 2019-09-01 ENCOUNTER — Other Ambulatory Visit: Payer: Self-pay

## 2019-09-01 ENCOUNTER — Telehealth: Payer: Self-pay | Admitting: *Deleted

## 2019-09-01 ENCOUNTER — Telehealth (INDEPENDENT_AMBULATORY_CARE_PROVIDER_SITE_OTHER): Payer: Medicaid Other | Admitting: Family Medicine

## 2019-09-01 DIAGNOSIS — Z029 Encounter for administrative examinations, unspecified: Secondary | ICD-10-CM

## 2019-09-01 NOTE — Telephone Encounter (Signed)
Mr. David [name], David [name] are scheduled for a virtual visit with your provider today.    Just as we do with appointments in the office, we must obtain your consent to participate.  Your consent will be active for this visit and any virtual visit you may have with one of our providers in the next 365 days.    If you have a MyChart account, I can also send a copy of this consent to you electronically.  All virtual visits are billed to your insurance company just like a traditional visit in the office.  As this is a virtual visit, video technology does not allow for your provider to perform a traditional examination.  This may limit your provider's ability to fully assess your condition.  If your provider identifies any concerns that need to be evaluated in person or the need to arrange testing such as labs, EKG, etc, we will make arrangements to do so.    Although advances in technology are sophisticated, we cannot ensure that it will always work on either your end or our end.  If the connection with a video visit is poor, we may have to switch to a telephone visit.  With either a video or telephone visit, we are not always able to ensure that we have a secure connection.   I need to obtain your verbal consent now.   Are you willing to proceed with your visit today?   [name] has provided verbal consent on 09/01/2019 for a virtual visit (video or telephone).   Kathleen Lime, RN 09/01/2019  9:42 AM

## 2019-09-01 NOTE — Progress Notes (Signed)
Pt no showed visit.

## 2019-09-18 ENCOUNTER — Encounter: Payer: Self-pay | Admitting: Family Medicine

## 2020-03-23 ENCOUNTER — Ambulatory Visit (INDEPENDENT_AMBULATORY_CARE_PROVIDER_SITE_OTHER): Payer: Self-pay | Admitting: Family Medicine

## 2020-03-23 ENCOUNTER — Encounter: Payer: Self-pay | Admitting: Family Medicine

## 2020-03-23 ENCOUNTER — Other Ambulatory Visit: Payer: Self-pay

## 2020-03-23 VITALS — BP 110/70 | HR 94 | Temp 98.0°F | Ht 69.0 in | Wt 226.0 lb

## 2020-03-23 DIAGNOSIS — N50811 Right testicular pain: Secondary | ICD-10-CM

## 2020-03-23 DIAGNOSIS — K130 Diseases of lips: Secondary | ICD-10-CM

## 2020-03-23 MED ORDER — TRIAMCINOLONE ACETONIDE 0.1 % EX CREA: 1.0000 "application " | TOPICAL_CREAM | Freq: Four times a day (QID) | CUTANEOUS | 0 refills | Status: DC

## 2020-03-23 NOTE — Progress Notes (Signed)
   Subjective:    Patient ID: David [name], male    DOB: October 14, 1995, 24 y.o.   MRN: [id number]  HPIpain in groin area. Started 4 -5 months ago. Aching pain.  Testicular pain and discomfort on the right side.  Often hurts with ejaculation and with relations.  Also states at times it limits him.  Does not wake him up at night.  Pain is multiple days per week its been going on for a few months.  Denies any hematuria.  His mental health medications do make it difficult for him to achieve climax and sometimes cause retrograde ejaculation but he has not had any major setbacks   Review of Systems See above    Objective:   Physical Exam  No hernia testicular exam is normal abdomen is soft no guarding rebound no masses bowel Patient also with angular cheilitis that he is concerned about     Assessment & Plan:  Testicular pain Exam is normal Referral to urology they may decide to do ultrasound to look at this area closer No antibiotics indicated  Also angular cheilitis more than likely related to licking the corner of his lips we discussed how to prevent this but also use antifungal cream over the course the next few weeks notify us if ongoing troubles

## 2020-03-25 ENCOUNTER — Telehealth: Payer: Self-pay | Admitting: Family Medicine

## 2020-04-06 ENCOUNTER — Other Ambulatory Visit: Payer: Self-pay

## 2020-04-06 ENCOUNTER — Ambulatory Visit (INDEPENDENT_AMBULATORY_CARE_PROVIDER_SITE_OTHER): Payer: Medicaid Other | Admitting: Urology

## 2020-04-06 ENCOUNTER — Encounter: Payer: Self-pay | Admitting: Urology

## 2020-04-06 VITALS — BP 147/83 | HR 114 | Temp 98.8°F | Ht 69.0 in | Wt 226.0 lb

## 2020-04-06 DIAGNOSIS — R1030 Lower abdominal pain, unspecified: Secondary | ICD-10-CM | POA: Diagnosis not present

## 2020-04-06 NOTE — Progress Notes (Signed)
Bladder Scan Patient cannot void: 62 ml Performed By: Bridgette Habermann, LPN   Urological Symptom Review  Patient is experiencing the following symptoms: Frequent urination Get up at night to urinate Stream starts and stops   Review of Systems  Gastrointestinal (upper)  : Vomiting  Gastrointestinal (lower) : Diarrhea  Constitutional : Negative for symptoms  Skin: Negative for skin symptoms  Eyes: Negative for eye symptoms  Ear/Nose/Throat : Negative for Ear/Nose/Throat symptoms  Hematologic/Lymphatic: Negative for Hematologic/Lymphatic symptoms  Cardiovascular : Negative for cardiovascular symptoms  Respiratory : Negative for respiratory symptoms  Endocrine: Negative for endocrine symptoms  Musculoskeletal: Negative for musculoskeletal symptoms  Neurological: Negative for neurological symptoms  Psychologic: Depression Anxiety

## 2020-04-06 NOTE — Patient Instructions (Signed)
Hernia, Adult     A hernia happens when tissue inside your body pushes out through a weak spot in your belly muscles (abdominal wall). This makes a round lump (bulge). The lump may be:  In a scar from surgery that was done in your belly (incisional hernia).  Near your belly button (umbilical hernia).  In your groin (inguinal hernia). Your groin is the area where your leg meets your lower belly (abdomen). This kind of hernia could also be: ? In your scrotum, if you are male. ? In folds of skin around your vagina, if you are male.  In your upper thigh (femoral hernia).  Inside your belly (hiatal hernia). This happens when your stomach slides above the muscle between your belly and your chest (diaphragm). If your hernia is small and it does not cause pain, you may not need treatment. If your hernia is large or it causes pain, you may need surgery. Follow these instructions at home: Activity  Avoid stretching or overusing (straining) the muscles near your hernia. Straining can happen when you: ? Lift something heavy. ? Poop (have a bowel movement).  Do not lift anything that is heavier than 10 lb (4.5 kg), or the limit that you are told, until your doctor says that it is safe.  Use the strength of your legs when you lift something heavy. Do not use only your back muscles to lift. General instructions  Do these things if told by your doctor so you do not have trouble pooping (constipation): ? Drink enough fluid to keep your pee (urine) pale yellow. ? Eat foods that are high in fiber. These include fresh fruits and vegetables, whole grains, and beans. ? Limit foods that are high in fat and processed sugars. These include foods that are fried or sweet. ? Take medicine for trouble pooping.  When you cough, try to cough gently.  You may try to push your hernia in by very gently pressing on it when you are lying down. Do not try to force the bulge back in if it will not push in  easily.  If you are overweight, work with your doctor to lose weight safely.  Do not use any products that have nicotine or tobacco in them. These include cigarettes and e-cigarettes. If you need help quitting, ask your doctor.  If you will be having surgery (hernia repair), watch your hernia for changes in shape, size, or color. Tell your doctor if you see any changes.  Take over-the-counter and prescription medicines only as told by your doctor.  Keep all follow-up visits as told by your doctor. Contact a doctor if:  You get new pain, swelling, or redness near your hernia.  You poop fewer times in a week than normal.  You have trouble pooping.  You have poop (stool) that is more dry than normal.  You have poop that is harder or larger than normal. Get help right away if:  You have a fever.  You have belly pain that gets worse.  You feel sick to your stomach (nauseous).  You throw up (vomit).  Your hernia cannot be pushed in by very gently pressing on it when you are lying down. Do not try to force the bulge back in if it will not push in easily.  Your hernia: ? Changes in shape or size. ? Changes color. ? Feels hard or it hurts when you touch it. These symptoms may represent a serious problem that is an emergency. Do not   wait to see if the symptoms will go away. Get medical help right away. Call your local emergency services (911 in the [place] Summary  A hernia happens when tissue inside your body pushes out through a weak spot in the belly muscles. This creates a bulge.  If your hernia is small and it does not hurt, you may not need treatment. If your hernia is large or it hurts, you may need surgery.  If you will be having surgery, watch your hernia for changes in shape, size, or color. Tell your doctor about any changes. This information is not intended to replace advice given to you by your health care provider. Make sure you discuss any questions you have with  your health care provider. Document Revised: 08/15/2018 Document Reviewed: 01/24/2017 Elsevier Patient Education  2020 [employer]vier Inc[employer]  

## 2020-04-06 NOTE — Progress Notes (Signed)
04/06/2020 1:52 PM   Theressa Stamps 1995-11-17 [id number]  Referring provider: Babs Sciara, MD [address] [place],  [place] [address]  Right testicular pain  HPI: Mr David [name] is a 24yo here for new right testicular pain. Starting May 2021 he notes intermittent right testicular pain. No associated trauma. He was lifting weights around that time and the pain starting shortly after doing a heavy workout. He denies any pain with urinating. No hx of nephrolithiasis. He does have intermittent pain with ejaculation. The pain is dull and last 5-10 minutes during the episode.    PMH: Past Medical History:  Diagnosis Date  . Anxiety   . Bipolar 1 disorder (HCC)   . Schizoaffective disorder [facility])     Surgical History: Past Surgical History:  Procedure Laterality Date  . TYMPANOSTOMY TUBE PLACEMENT      Home Medications:  Allergies as of 04/06/2020      Reactions   Eggs Or Egg-derived Products    Other reaction(s): Tongue swelling      Medication List       Accurate as of April 06, 2020  1:52 PM. If you have any questions, ask your nurse or doctor.        benztropine 0.5 MG tablet Commonly known as: COGENTIN Take 0.5 mg by mouth 2 (two) times daily. What changed: Another medication with the same name was removed. Continue taking this medication, and follow the directions you see here. Changed by: Wilkie Aye, MD   gabapentin 100 MG capsule Commonly known as: NEURONTIN Take 1 capsule (100 mg total) by mouth 3 (three) times daily.   RisperDAL 2 MG tablet Generic drug: risperiDONE Take by mouth.   triamcinolone 0.1 % Commonly known as: KENALOG Apply 1 application topically 4 (four) times daily.       Allergies:  Allergies  Allergen Reactions  . Eggs Or Egg-Derived Products     Other reaction(s): Tongue swelling    Family History: Family History  Problem Relation Age of Onset  . Mental illness Mother   . Heart attack Other      Social History:  reports that he has been smoking cigarettes and cigars. He has never used smokeless tobacco. He reports that he does not drink alcohol and does not use drugs.  ROS: All other review of systems were reviewed and are negative except what is noted above in HPI  Physical Exam: BP (!) 147/83   Pulse (!) 114   Temp 98.8 F (37.1 C)   Ht 5\' 9"  (1.753 m)   Wt 226 lb (102.5 kg)   BMI 33.37 kg/m   Constitutional:  Alert and oriented, No acute distress. HEENT: [place] AT, moist mucus membranes.  Trachea midline, no masses. Cardiovascular: No clubbing, cyanosis, or edema. Respiratory: Normal respiratory effort, no increased work of breathing. GI: Abdomen is soft, nontender, nondistended, no abdominal masses GU: No CVA tenderness. Circumcised phallus. No masses/lesions on penis, testis, scrotum. Right inguinal hernia Lymph: No cervical or inguinal lymphadenopathy. Skin: No rashes, bruises or suspicious lesions. Neurologic: Grossly intact, no focal deficits, moving all 4 extremities. Psychiatric: Normal mood and affect.  Laboratory Data: Lab Results  Component Value Date   WBC 8.3 05/25/2017   HGB 15.3 05/25/2017   HCT 47.7 05/25/2017   MCV 90.9 05/25/2017   PLT 213 05/25/2017    Lab Results  Component Value Date   CREATININE 0.72 05/25/2017    No results found for: PSA  No results found for:  TESTOSTERONE  Lab Results  Component Value Date   HGBA1C 5.2 05/14/2017    Urinalysis No results found for: COLORURINE, APPEARANCEUR, LABSPEC, PHURINE, GLUCOSEU, HGBUR, BILIRUBINUR, KETONESUR, PROTEINUR, UROBILINOGEN, NITRITE, LEUKOCYTESUR  No results found for: LABMICR, WBCUA, RBCUA, LABEPIT, MUCUS, BACTERIA  Pertinent Imaging:  No results found for this or any previous visit.  No results found for this or any previous visit.  No results found for this or any previous visit.  No results found for this or any previous visit.  No results found for this or any  previous visit.  No results found for this or any previous visit.  No results found for this or any previous visit.  No results found for this or any previous visit.   Assessment & Plan:    1. Inguinal pain, unspecified laterality -likely related to right inguinal hernia. I will refer him to Dr. Lovell Sheehan. - BLADDER SCAN AMB NON-IMAGING - Urinalysis, Routine w reflex microscopic   No follow-ups on file.  Wilkie Aye, MD  [facility] [place]

## 2020-05-27 ENCOUNTER — Encounter: Payer: Self-pay | Admitting: General Surgery

## 2020-05-27 ENCOUNTER — Other Ambulatory Visit: Payer: Self-pay

## 2020-05-27 ENCOUNTER — Ambulatory Visit (INDEPENDENT_AMBULATORY_CARE_PROVIDER_SITE_OTHER): Payer: Self-pay | Admitting: General Surgery

## 2020-05-27 VITALS — BP 114/78 | HR 77 | Temp 97.9°F | Resp 16 | Ht 68.5 in | Wt 225.0 lb

## 2020-05-27 DIAGNOSIS — R1031 Right lower quadrant pain: Secondary | ICD-10-CM

## 2020-05-27 NOTE — Progress Notes (Signed)
[facility] History and Physical  Reason for Referral: ? Right inguinal hernia  Referring Physician: Dr. Ronne Binning   Chief Complaint    Hernia      David [name] is a 25 y.o. male.  HPI: David [name] is as 25 yo who has been seen by Dr. Ronne Binning for right sided scrotal pain related to ejaculation. He confirms this story for me today and says he mostly has pain after ejaculation. He describes pain on the right and left at the bottom of his scrotum. He says he has some burning with urination in the past. He has not felt a bulge but he is active at work and lifts weights and things at work. He has tried to avoid lifting after Dr. Ronne Binning felt a possible hernia.  The patient denies any penile discharge.  The pain is transient in nature after ejaculation.   Past Medical History:  Diagnosis Date  . Anxiety   . Bipolar 1 disorder (HCC)   . Schizoaffective disorder [facility])     Past Surgical History:  Procedure Laterality Date  . TYMPANOSTOMY TUBE PLACEMENT      Family History  Problem Relation Age of Onset  . Mental illness Mother   . Heart attack Other     Social History   Tobacco Use  . Smoking status: Current Some Day Smoker    Types: Cigarettes, Cigars  . Smokeless tobacco: Never Used  Substance Use Topics  . Alcohol use: No  . Drug use: No    Medications: I have reviewed the patient's current medications. Allergies as of 05/27/2020      Reactions   Eggs Or Egg-derived Products    Other reaction(s): Tongue swelling      Medication List       Accurate as of May 27, 2020  1:31 PM. If you have any questions, ask your nurse or doctor.        STOP taking these medications   triamcinolone 0.1 % Commonly known as: KENALOG Stopped by: Lucretia Roers, MD     TAKE these medications   benztropine 0.5 MG tablet Commonly known as: COGENTIN Take 0.5 mg by mouth 2 (two) times daily.   gabapentin 100 MG capsule Commonly known as:  NEURONTIN Take 1 capsule (100 mg total) by mouth 3 (three) times daily.   risperiDONE 2 MG tablet Commonly known as: RISPERDAL Take by mouth.        ROS:  A comprehensive review of systems was negative except for: Gastrointestinal: positive for right groin /scrotal pain  Blood pressure 114/78, pulse 77, temperature 97.9 F (36.6 C), temperature source Other (Comment), resp. rate 16, height 5' 8.5" (1.74 m), weight 225 lb (102.1 kg), SpO2 95 %. Physical Exam Vitals reviewed. Exam conducted with a chaperone present.  Constitutional:      Appearance: Normal appearance.  HENT:     Head: Normocephalic.     Nose: Nose normal.  Eyes:     Extraocular Movements: Extraocular movements intact.  Cardiovascular:     Rate and Rhythm: Normal rate and regular rhythm.  Pulmonary:     Effort: Pulmonary effort is normal.     Breath sounds: Normal breath sounds.  Abdominal:     General: There is no distension.     Palpations: Abdomen is soft.     Tenderness: There is no abdominal tenderness.     Hernia: No hernia is present. There is no hernia in the left inguinal area or right inguinal area.  Genitourinary:    Penis: Normal.      Testes: Normal.     Comments: Pain in the inferior scrotal area on right and left, tenderness around the right internal ring but no obvious hernia felt with supine or standing examination Musculoskeletal:        General: Normal range of motion.     Cervical back: Normal range of motion.  Skin:    General: Skin is warm.  Neurological:     General: No focal deficit present.     Mental Status: He is alert and oriented to person, place, and time.  Psychiatric:        Mood and Affect: Mood normal.        Behavior: Behavior normal.        Thought Content: Thought content normal.        Judgment: Judgment normal.     Results: None   Assessment & Plan:  RUVIM RISKO is a 25 y.o. male with right groin / scrotal pain and some left sided pain. He has not  seen a bulge and says the pain is after ejaculation.  I cannot fully explain this Am. Dr. Dimas Millin note had said to get a UA which has not been done, and I ordered this and it was normal.  The patient does not have insurance and is self pay out of pocket.  We discussed the option of imaging starting with [place] to see if there is any pathology that could cause the pain versus CT if this is not much more expensive and discussed the option of another opinion or later exam.    -[place] will be ordered and will call with results and next plan   All questions were answered to the satisfaction of the patient.   Lucretia Roers 05/27/2020, 1:31 PM

## 2020-05-27 NOTE — Patient Instructions (Signed)
No hernia felt on my exam. Will get you to get the urine sample from Dr. Dimas Millin office. Will order an [place] to further assess the scrotum and groin area for any abnormalities. May need additional imaging if this does not find anything. Will call you once we get the [place] results and discuss.   If you start to have a bulge or notice something different please call and let us know.  Information / Symptoms of Possible hernia Inguinal Hernia, Adult An inguinal hernia is when fat or your intestines push through a weak spot in a muscle where your leg meets your lower belly (groin). This causes a bulge. This kind of hernia could also be:  In your scrotum, if you are male.  In folds of skin around your vagina, if you are male. There are three types of inguinal hernias:  Hernias that can be pushed back into the belly (are reducible). This type rarely causes pain.  Hernias that cannot be pushed back into the belly (are incarcerated).  Hernias that cannot be pushed back into the belly and lose their blood supply (are strangulated). This type needs emergency surgery. What are the causes? This condition is caused by having a weak spot in the muscles or tissues in your groin. This develops over time. The hernia may poke through the weak spot when you strain your lower belly muscles all of a sudden, such as when you:  Lift a heavy object.  Strain to poop (have a bowel movement). Trouble pooping (constipation) can lead to straining.  Cough. What increases the risk? This condition is more likely to develop in:  Males.  Pregnant females.  People who: ? Are overweight. ? Work in jobs that require long periods of standing or heavy lifting. ? Have had an inguinal hernia before. ? Smoke or have lung disease. These factors can lead to long-term (chronic) coughing. What are the signs or symptoms? Symptoms may depend on the size of the hernia. Often, a small hernia has no symptoms. Symptoms of a  larger hernia may include:  A bulge in the groin area. This is easier to see when standing. You might not be able to see it when you are lying down.  Pain or burning in the groin. This may get worse when you lift, strain, or cough.  A dull ache or a feeling of pressure in the groin.  An abnormal bulge in the scrotum, in males. Symptoms of a strangulated inguinal hernia may include:  A bulge in your groin that is very painful and tender to the touch.  A bulge that turns red or purple.  Fever, feeling like you may vomit (nausea), and vomiting.  Not being able to poop or to pass gas. How is this treated? Treatment depends on the size of your hernia and whether you have symptoms. If you do not have symptoms, your doctor may have you watch your hernia carefully and have you come in for follow-up visits. If your hernia is large or if you have symptoms, you may need surgery to repair the hernia. Follow these instructions at home: Lifestyle  Avoid lifting heavy objects.  Avoid standing for long amounts of time.  Do not smoke or use any products that contain nicotine or tobacco. If you need help quitting, ask your doctor.  Stay at a healthy weight. Prevent trouble pooping You may need to take these actions to prevent or treat trouble pooping:  Drink enough fluid to keep your pee (urine) pale  yellow.  Take over-the-counter or prescription medicines.  Eat foods that are high in fiber. These include beans, whole grains, and fresh fruits and vegetables.  Limit foods that are high in fat and sugar. These include fried or sweet foods. General instructions  You may try to push your hernia back in place by very gently pressing on it when you are lying down. Do not try to push the bulge back in if it will not go in easily.  Watch your hernia for any changes in shape, size, or color. Tell your doctor if you see any changes.  Take over-the-counter and prescription medicines only as told by  your doctor.  Keep all follow-up visits. Contact a doctor if:  You have a fever or chills.  You have new symptoms.  Your symptoms get worse. Get help right away if:  You have pain in your groin that gets worse all of a sudden.  You have a bulge in your groin that: ? Gets bigger all of a sudden, and it does not get smaller after that. ? Turns red or purple. ? Is painful when you touch it.  You are a male, and you have: ? Sudden pain in your scrotum. ? A sudden change in the size of your scrotum.  You cannot push the hernia back in place by very gently pressing on it when you are lying down.  You feel like you may vomit, and that feeling does not go away.  You keep vomiting.  You have a fast heartbeat.  You cannot poop or pass gas. These symptoms may be an emergency. Get help right away. Call your local emergency services (911 in the [place]  Do not wait to see if the symptoms will go away.  Do not drive yourself to the hospital. Summary  An inguinal hernia is when fat or your intestines push through a weak spot in a muscle where your leg meets your lower belly (groin). This causes a bulge.  If you do not have symptoms, you may not need treatment. If you have symptoms or a large hernia, you may need surgery.  Avoid lifting heavy objects. Also, avoid standing for long amounts of time.  Do not try to push the bulge back in if it will not go in easily. This information is not intended to replace advice given to you by your health care provider. Make sure you discuss any questions you have with your health care provider. Document Revised: 12/23/2019 Document Reviewed: 12/23/2019 Elsevier Patient Education  2021 [employer].

## 2020-05-27 NOTE — Addendum Note (Signed)
Addended byGustavus Messing on: 05/27/2020 02:11 PM   Modules accepted: Orders

## 2020-05-28 LAB — URINALYSIS, ROUTINE W REFLEX MICROSCOPIC
Bilirubin, UA: NEGATIVE
Glucose, UA: NEGATIVE
Ketones, UA: NEGATIVE
Leukocytes,UA: NEGATIVE
Nitrite, UA: NEGATIVE
Protein,UA: NEGATIVE
RBC, UA: NEGATIVE
Specific Gravity, UA: 1.021 (ref 1.005–1.030)
Urobilinogen, Ur: 0.2 mg/dL (ref 0.2–1.0)
pH, UA: 6 (ref 5.0–7.5)

## 2021-10-04 ENCOUNTER — Ambulatory Visit
Admission: EM | Admit: 2021-10-04 | Discharge: 2021-10-04 | Disposition: A | Discharge status: Home / Self Care | Payer: Medicaid Other | Attending: Family Medicine | Admitting: Family Medicine

## 2021-10-04 DIAGNOSIS — J0100 Acute maxillary sinusitis, unspecified: Secondary | ICD-10-CM

## 2021-10-04 DIAGNOSIS — K580 Irritable bowel syndrome with diarrhea: Secondary | ICD-10-CM

## 2021-10-04 DIAGNOSIS — R112 Nausea with vomiting, unspecified: Secondary | ICD-10-CM

## 2021-10-04 MED ORDER — ONDANSETRON 4 MG PO TBDP: 4.0000 mg | ORAL_TABLET | Freq: Three times a day (TID) | ORAL | 0 refills | Status: DC | PRN

## 2021-10-04 MED ORDER — AMOXICILLIN-POT CLAVULANATE 875-125 MG PO TABS: 1.0000 | ORAL_TABLET | Freq: Two times a day (BID) | ORAL | 0 refills | Status: DC

## 2021-10-04 MED ORDER — DICYCLOMINE HCL 20 MG PO TABS: 20.0000 mg | ORAL_TABLET | Freq: Two times a day (BID) | ORAL | 0 refills | Status: DC | PRN

## 2021-10-04 NOTE — ED Triage Notes (Signed)
Pt states states he has ahd a sinus infection for 3 weeks  Pt states he has a runny, stopped up nose with yellow, clear mucus  Pt states he vomited on Sunday and yesterday  Pt state prescribed Claritin daily  Denies Fever

## 2021-10-08 NOTE — ED Provider Notes (Signed)
[facility]    CSN: [id number] Arrival date & time: 10/04/21  1125      History   Chief Complaint Chief Complaint  Patient presents with   Nausea    Sinus issues and nausea and vomiting    HPI David [name] is a 26 y.o. male.   Presenting today with 3-week history of nasal congestion, sinus pain and pressure, vomiting and diarrhea off and on, fatigue, cough.  States symptoms are significantly worsening the past several days.  Denies fever, chills, body aches, chest pain, shortness of breath  Taking Claritin daily and has tried cold and congestion medication with minimal relief.  No new sick contacts recently.  History of seasonal allergies compliant with regimen.  He states he has a history of IBS which is where he thinks his nausea and vomiting and diarrhea is coming from, tried his grandmothers Bentyl which seemed to help in the past.   Past Medical History:  Diagnosis Date   Anxiety    Bipolar 1 disorder ([place])    Schizoaffective disorder ([place])     Patient Active Problem List   Diagnosis Date Noted   Right groin pain 05/27/2020   Xerostomia 06/16/2017   Pharyngoesophageal dysphagia 06/16/2017   Schizoaffective disorder, depressive type ([place]) 05/11/2017   Psychosis ([place]) 05/11/2017   Vitamin D deficiency 01/07/2016   Gastroesophageal reflux disease with esophagitis 08/31/2015   Anxiety 08/31/2015   Irritable bowel syndrome 08/30/2012   Abdominal pain, chronic, epigastric 08/02/2012   Left ankle sprain 08/02/2012    Past Surgical History:  Procedure Laterality Date   TYMPANOSTOMY TUBE PLACEMENT         Home Medications    Prior to Admission medications   Medication Sig Start Date End Date Taking? Authorizing Provider  amoxicillin-clavulanate (AUGMENTIN) 875-125 MG tablet Take 1 tablet by mouth every 12 (twelve) hours. 10/04/21  Yes Volney American, PA-C  dicyclomine (BENTYL) 20 MG tablet Take 1 tablet (20 mg total) by mouth 2 (two)  times daily as needed for spasms. 10/04/21  Yes Volney American, PA-C  ondansetron (ZOFRAN-ODT) 4 MG disintegrating tablet Take 1 tablet (4 mg total) by mouth every 8 (eight) hours as needed for nausea or vomiting. 10/04/21  Yes Volney American, PA-C  benztropine (COGENTIN) 0.5 MG tablet Take 0.5 mg by mouth 2 (two) times daily. 04/05/20   [provider]  gabapentin (NEURONTIN) 100 MG capsule Take 1 capsule (100 mg total) by mouth 3 (three) times daily. 05/19/17   Derrill Center, NP  risperiDONE (RISPERDAL) 2 MG tablet Take by mouth. 01/05/20   [provider]    Family History Family History  Problem Relation Age of Onset   Mental illness Mother    Heart attack Other     Social History Social History   Tobacco Use   Smoking status: Some Days    Types: Cigars   Smokeless tobacco: Never  Vaping Use   Vaping Use: Never used  Substance Use Topics   Alcohol use: No   Drug use: No     Allergies   Eggs or egg-derived products   Review of Systems Review of Systems Per HPI  Physical Exam Triage Vital Signs ED Triage Vitals  Enc Vitals Group     BP 10/04/21 1232 121/78     Pulse Rate 10/04/21 1232 83     Resp 10/04/21 1232 20     Temp 10/04/21 1232 97.7 F (36.5 C)     Temp  Source 10/04/21 1232 Oral     SpO2 10/04/21 1232 97 %     Weight --      Height --      Head Circumference --      Peak Flow --      Pain Score 10/04/21 1231 4     Pain Loc --      Pain Edu? --      Excl. in [place]? --    No data found.  Updated Vital Signs BP 121/78 (BP Location: Right Arm)   Pulse 83   Temp 97.7 F (36.5 C) (Oral)   Resp 20   SpO2 97%   Visual Acuity Right Eye Distance:   Left Eye Distance:   Bilateral Distance:    Right Eye Near:   Left Eye Near:    Bilateral Near:     Physical Exam Vitals and nursing note reviewed.  Constitutional:      Appearance: Normal appearance.  HENT:     Head: Atraumatic.     Right Ear: Tympanic membrane  normal.     Left Ear: Tympanic membrane normal.     Nose: Congestion present.     Mouth/Throat:     Mouth: Mucous membranes are moist.     Pharynx: Posterior oropharyngeal erythema present.  Eyes:     Extraocular Movements: Extraocular movements intact.     Conjunctiva/sclera: Conjunctivae normal.  Cardiovascular:     Rate and Rhythm: Normal rate and regular rhythm.  Pulmonary:     Effort: Pulmonary effort is normal.     Breath sounds: Normal breath sounds. No wheezing or rales.  Abdominal:     General: Bowel sounds are normal. There is no distension.     Palpations: Abdomen is soft.     Tenderness: There is no abdominal tenderness. There is no guarding.  Musculoskeletal:        General: Normal range of motion.     Cervical back: Normal range of motion and neck supple.  Skin:    General: Skin is warm and dry.  Neurological:     General: No focal deficit present.     Mental Status: He is oriented to person, place, and time.  Psychiatric:        Mood and Affect: Mood normal.        Thought Content: Thought content normal.        Judgment: Judgment normal.     UC Treatments / Results  Labs (all labs ordered are listed, but only abnormal results are displayed) Labs Reviewed - No data to display  EKG   Radiology No results found.  Procedures Procedures (including critical care time)  Medications Ordered in UC Medications - No data to display  Initial Impression / Assessment and Plan / UC Course  I have reviewed the triage vital signs and the nursing notes.  Pertinent labs & imaging results that were available during my care of the patient were reviewed by me and considered in my medical decision making (see chart for details).     Treat with Augmentin, sinus rinses, nasal sprays, allergy regimen for sinusitis.  Zofran, Bentyl sent for IBS, nausea and vomiting.  Brat diet, fluids.  Follow-up with PCP for recheck.  Final Clinical Impressions(s) / UC Diagnoses    Final diagnoses:  Acute non-recurrent maxillary sinusitis  Nausea and vomiting, unspecified vomiting type  Irritable bowel syndrome with diarrhea   Discharge Instructions   None    ED Prescriptions  Medication Sig Dispense Auth. Provider   amoxicillin-clavulanate (AUGMENTIN) 875-125 MG tablet Take 1 tablet by mouth every 12 (twelve) hours. 14 tablet Volney American, PA-C   ondansetron (ZOFRAN-ODT) 4 MG disintegrating tablet Take 1 tablet (4 mg total) by mouth every 8 (eight) hours as needed for nausea or vomiting. 20 tablet Volney American, [place]   dicyclomine (BENTYL) 20 MG tablet Take 1 tablet (20 mg total) by mouth 2 (two) times daily as needed for spasms. 20 tablet Volney American, [place]      PDMP not reviewed this encounter.   Volney American, [place] 10/08/21 [phone]

## 2022-09-27 ENCOUNTER — Ambulatory Visit (INDEPENDENT_AMBULATORY_CARE_PROVIDER_SITE_OTHER): Payer: Self-pay | Admitting: Family Medicine

## 2022-09-27 VITALS — BP 124/68 | HR 82 | Temp 98.4°F | Ht 68.5 in | Wt 214.6 lb

## 2022-09-27 DIAGNOSIS — R432 Parageusia: Secondary | ICD-10-CM

## 2022-09-27 DIAGNOSIS — Z72821 Inadequate sleep hygiene: Secondary | ICD-10-CM

## 2022-09-27 DIAGNOSIS — R430 Anosmia: Secondary | ICD-10-CM

## 2022-09-27 DIAGNOSIS — U099 Post covid-19 condition, unspecified: Secondary | ICD-10-CM

## 2022-09-27 NOTE — Progress Notes (Unsigned)
   Subjective:    Patient ID: David [name], male    DOB: 03/19/1996, 27 y.o.   MRN: [id number]  HPI Pt c/o loss of taste and smell since Covid.  Patient having some difficulty with persistent taste issues ever since having COVID Also a lot of nasal intermittent congestion and loss of sense of smell denies wheezing discomforts high fever chills sweats  Pt also c/o insomnia.  Upon further discussion it is not really insomnia is just poor sleep habits he works in the evenings gets home around 1 AM-2 AM then often behavior relaxed watching TV may be playing a video game and then go to bed around 4 or 5 AM and then sleep anywhere from 6 to 9 hours may get up have a couple hours of awake time before going to work again    Review of Systems    Objective:   Physical Exam Lungs clear heart regular pulse normal HEENT is benign       Assessment & Plan:  There is no good answer for lack of smell and taste unfortunately long-term typically this has issues that will not recover Perhaps he might be able to get some of this back I recommend changing his sense of taste and smell with salty, sweet, spicy such as vinegar-based barbecue  He may also challenges since of smell with onions and other fragrant type smells  As for the sleep we did discuss in detail that this is more of a choice on his part if he desires to sleep at more normal hours I would recommend when he comes home from work around 1 or 2 AM to spend about 1 hour relaxing showering may be watching a little TV and then going to bed and then getting up around noon or 1:00 to stay out in the afternoon I think he would feel better if he had more exposure to natural light  I also highly recommended minimizing caffeine related products

## 2022-09-28 DIAGNOSIS — R430 Anosmia: Secondary | ICD-10-CM | POA: Insufficient documentation

## 2022-09-28 DIAGNOSIS — Z72821 Inadequate sleep hygiene: Secondary | ICD-10-CM | POA: Insufficient documentation

## 2022-11-03 ENCOUNTER — Ambulatory Visit
Admission: EM | Admit: 2022-11-03 | Discharge: 2022-11-03 | Disposition: A | Discharge status: Home / Self Care | Payer: Self-pay | Attending: Family Medicine | Admitting: Family Medicine

## 2022-11-03 ENCOUNTER — Encounter: Payer: Self-pay | Admitting: Emergency Medicine

## 2022-11-03 DIAGNOSIS — S29019A Strain of muscle and tendon of unspecified wall of thorax, initial encounter: Secondary | ICD-10-CM

## 2022-11-03 MED ORDER — NAPROXEN 500 MG PO TABS: 500.0000 mg | ORAL_TABLET | Freq: Two times a day (BID) | ORAL | 0 refills | Status: DC | PRN

## 2022-11-03 MED ORDER — CYCLOBENZAPRINE HCL 5 MG PO TABS: 5.0000 mg | ORAL_TABLET | Freq: Three times a day (TID) | ORAL | 0 refills | Status: DC | PRN

## 2022-11-03 NOTE — ED Provider Notes (Signed)
[facility]    CSN: [id number] Arrival date & time: 11/03/22  0825      History   Chief Complaint No chief complaint on file.   HPI David [name] is a 27 y.o. male.   Patient presenting today with left mid back pain for about 5 days now.  States he was lifting heavy TVs just prior to onset.  Pain is worse with certain movements and states he is stiff in this area after longer periods of sitting.  Denies any radiation of pain, extremity numbness tingling weakness or decreased range of motion, saddle paresthesias, bowel or bladder incontinence.  So far trying over-the-counter remedies such as pain relievers, IcyHot, heat, ice with mild temporary benefit.    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 09/28/2022   Poor sleep hygiene 09/28/2022   Right groin pain 05/27/2020   Xerostomia 06/16/2017   Pharyngoesophageal dysphagia 06/16/2017   Schizoaffective disorder, depressive type ([facility]) 05/11/2017   Psychosis ([facility]) 05/11/2017   Vitamin D deficiency 01/07/2016   Gastroesophageal reflux disease with esophagitis 08/31/2015   Anxiety 08/31/2015   Irritable bowel syndrome 08/30/2012   Abdominal pain, chronic, epigastric 08/02/2012   Left ankle sprain 08/02/2012    Past Surgical History:  Procedure Laterality Date   TYMPANOSTOMY TUBE PLACEMENT         Home Medications    Prior to Admission medications   Medication Sig Start Date End Date Taking? Authorizing Provider  cyclobenzaprine (FLEXERIL) 5 MG tablet Take 1 tablet (5 mg total) by mouth 3 (three) times daily as needed for muscle spasms. do not drink alcohol or drive while taking this medication.  May cause drowsiness. 11/03/22  Yes Particia Nearing, PA-C  naproxen (NAPROSYN) 500 MG tablet Take 1 tablet (500 mg total)  by mouth 2 (two) times daily as needed. 11/03/22  Yes Particia Nearing, PA-C  benztropine (COGENTIN) 0.5 MG tablet Take 0.5 mg by mouth 2 (two) times daily. 04/05/20   [provider]  gabapentin (NEURONTIN) 100 MG capsule Take 1 capsule (100 mg total) by mouth 3 (three) times daily. 05/19/17   Oneta Rack, NP  risperiDONE (RISPERDAL) 2 MG tablet Take by mouth. 01/05/20   [provider]    Family History Family History  Problem Relation Age of Onset   Mental illness Mother    Heart attack Other     Social History Social History   Tobacco Use   Smoking status: Former    Types: Cigars   Smokeless tobacco: Never  Vaping Use   Vaping Use: Never used  Substance Use Topics   Alcohol use: No   Drug use: No     Allergies   Egg-derived products   Review of Systems Review of Systems Per HPI  Physical Exam Triage Vital Signs ED Triage Vitals [11/03/22 0922]  Enc Vitals Group     BP (!) 145/85     Pulse Rate 80     Resp 18     Temp 98.3 F (36.8 C)     Temp Source Oral     SpO2 95 %     Weight      Height      Head Circumference      Peak Flow  Pain Score 5     Pain Loc      Pain Edu?      Excl. in [place]?    No data found.  Updated Vital Signs BP (!) 145/85 (BP Location: Right Arm)   Pulse 80   Temp 98.3 F (36.8 C) (Oral)   Resp 18   SpO2 95%   Visual Acuity Right Eye Distance:   Left Eye Distance:   Bilateral Distance:    Right Eye Near:   Left Eye Near:    Bilateral Near:     Physical Exam Vitals and nursing note reviewed.  Constitutional:      Appearance: Normal appearance.  HENT:     Head: Atraumatic.  Eyes:     Extraocular Movements: Extraocular movements intact.     Conjunctiva/sclera: Conjunctivae normal.  Cardiovascular:     Rate and Rhythm: Normal rate and regular rhythm.  Pulmonary:     Effort: Pulmonary effort is normal.     Breath sounds: Normal breath sounds.  Musculoskeletal:        General: Normal  range of motion.     Cervical back: Normal range of motion and neck supple.     Comments: No midline spinal tenderness to palpation diffusely.  Left thoracic paraspinal muscles tender to palpation.  Negative straight leg raise bilaterally.  Normal gait and range of motion diffusely  Skin:    General: Skin is warm and dry.  Neurological:     General: No focal deficit present.     Mental Status: He is oriented to person, place, and time.     Motor: No weakness.     Gait: Gait normal.  Psychiatric:        Mood and Affect: Mood normal.        Thought Content: Thought content normal.        Judgment: Judgment normal.      UC Treatments / Results  Labs (all labs ordered are listed, but only abnormal results are displayed) Labs Reviewed - No data to display  EKG   Radiology No results found.  Procedures Procedures (including critical care time)  Medications Ordered in UC Medications - No data to display  Initial Impression / Assessment and Plan / UC Course  I have reviewed the triage vital signs and the nursing notes.  Pertinent labs & imaging results that were available during my care of the patient were reviewed by me and considered in my medical decision making (see chart for details).     Consistent with thoracic muscular strain.  Treat with Flexeril, naproxen, heat, massage, stretches, rest.  Work note given.  Return for any worsening symptoms.  Final Clinical Impressions(s) / UC Diagnoses   Final diagnoses:  Thoracic myofascial strain, initial encounter   Discharge Instructions   None    ED Prescriptions     Medication Sig Dispense Auth. Provider   cyclobenzaprine (FLEXERIL) 5 MG tablet Take 1 tablet (5 mg total) by mouth 3 (three) times daily as needed for muscle spasms. do not drink alcohol or drive while taking this medication.  May cause drowsiness. 15 tablet Particia Nearing, [place]   naproxen (NAPROSYN) 500 MG tablet Take 1 tablet (500 mg total) by  mouth 2 (two) times daily as needed. 30 tablet Particia Nearing, [place]      PDMP not reviewed this encounter.   Particia Nearing, [place] 11/03/22 1016

## 2022-11-03 NOTE — ED Triage Notes (Signed)
Left sided back pain x 1 week.  Pain worse with movement.

## 2022-11-10 ENCOUNTER — Ambulatory Visit (INDEPENDENT_AMBULATORY_CARE_PROVIDER_SITE_OTHER): Payer: Self-pay | Admitting: Family Medicine

## 2022-11-10 VITALS — BP 120/84 | HR 98 | Wt 215.0 lb

## 2022-11-10 DIAGNOSIS — M549 Dorsalgia, unspecified: Secondary | ICD-10-CM

## 2022-11-10 MED ORDER — NAPROXEN 500 MG PO TABS: 500.0000 mg | ORAL_TABLET | Freq: Two times a day (BID) | ORAL | 0 refills | Status: DC | PRN

## 2022-11-10 MED ORDER — CYCLOBENZAPRINE HCL 5 MG PO TABS: 5.0000 mg | ORAL_TABLET | Freq: Three times a day (TID) | ORAL | 0 refills | Status: DC | PRN

## 2022-11-10 NOTE — Progress Notes (Signed)
   Subjective:    Patient ID: David [name], male    DOB: Oct 03, 1995, 27 y.o.   MRN: [id number]  HPI Patient arrives today for follow up.   Patient states he is having back issues. Present for 7 days Mid back pain Tried flex and naprosyn Feels tight   Review of Systems     Objective:   Physical Exam General-in no acute distress Eyes-no discharge Lungs-respiratory rate normal, CTA CV-no murmurs,RRR Extremities skin warm dry no edema Neuro grossly normal Behavior normal, alert Tenderness in the rhomboid muscle on the left side.  Negative straight leg raise tight hamstrings       Assessment & Plan:  Mid back pain Stretches were shown May use Naprosyn and Flexeril when necessary Do not drive with Flexeril If ongoing troubles follow-up Body weight exercises first then can progress toward more regular workouts as his body tolerates  Tight hamstring stretches were shown

## 2023-08-02 ENCOUNTER — Ambulatory Visit (INDEPENDENT_AMBULATORY_CARE_PROVIDER_SITE_OTHER): Payer: Self-pay | Admitting: Nurse Practitioner

## 2023-08-02 VITALS — BP 120/82 | Ht 68.5 in | Wt 209.0 lb

## 2023-08-02 DIAGNOSIS — G479 Sleep disorder, unspecified: Secondary | ICD-10-CM

## 2023-08-03 ENCOUNTER — Encounter: Payer: Self-pay | Admitting: Nurse Practitioner

## 2023-08-03 DIAGNOSIS — G479 Sleep disorder, unspecified: Secondary | ICD-10-CM | POA: Insufficient documentation

## 2023-08-03 NOTE — Progress Notes (Signed)
   Subjective:    Patient ID: David [name], male    DOB: [birth year], 28 y.o.   MRN: [id number]  HPI Presents for c/o chronic sleep issues. Worked a second shift job for several years. Bedtime varies but can be as late as 3 am. Average 1-2 hours to fall alseep. Sleeps 8-12 hours most nights. Hard to get up even with alarm clock. Was supposed to take out the trash at 8 am this morning but could not get up. See psychiatrist for medication management and doing well on current regimen. States he is doing the best he has done in years. Has applied for a new job.   Review of Systems  Constitutional:  Positive for fatigue.  Respiratory:  Negative for cough, chest tightness, shortness of breath and wheezing.   Cardiovascular:  Negative for chest pain.  Psychiatric/Behavioral:  Positive for sleep disturbance. Negative for dysphoric mood and suicidal ideas. The patient is not nervous/anxious.         08/02/2023    3:56 PM  Depression screen PHQ 2/9  Decreased Interest 0  Down, Depressed, Hopeless 0  PHQ - 2 Score 0   Social History   Tobacco Use   Smoking status: Former    Types: Cigars   Smokeless tobacco: Never  Vaping Use   Vaping status: Never Used  Substance Use Topics   Alcohol use: No   Drug use: No         Objective:   Physical Exam Vitals and nursing note reviewed.  Constitutional:      General: He is not in acute distress.    Appearance: Normal appearance.  Cardiovascular:     Rate and Rhythm: Normal rate and regular rhythm.     Heart sounds: Normal heart sounds. No murmur heard. Pulmonary:     Effort: Pulmonary effort is normal.     Breath sounds: Normal breath sounds.  Neurological:     Mental Status: He is alert.  Psychiatric:        Attention and Perception: Attention normal.        Mood and Affect: Mood normal.        Speech: Speech normal.        Behavior: Behavior normal.        Thought Content: Thought content normal.        Cognition and Memory:  Cognition normal.        Judgment: Judgment normal.    Today's Vitals   08/02/23 1555  BP: 120/82  Weight: 209 lb (94.8 kg)  Height: 5' 8.5" (1.74 m)   Body mass index is 31.32 kg/m.         Assessment & Plan:   Problem List Items Addressed This Visit       Other   Sleep disturbance - Primary   Sleep issues most likely due to medications, current pattern and Circadian rhythm. Patient is still on second shift pattern of sleep. No issues when he goes to sleep. Slowly try to get to bed earlier by one hour each night over time.  Discussed sleep hygiene.  Return if symptoms worsen or fail to improve.
